# Patient Record
Sex: Female | Born: 1945
Health system: Southern US, Community
[De-identification: ages and names within clinical notes are randomized; demographics above are authoritative.]

## PROBLEM LIST (undated history)

## (undated) DIAGNOSIS — G709 Myoneural disorder, unspecified: Secondary | ICD-10-CM

## (undated) DIAGNOSIS — F419 Anxiety disorder, unspecified: Secondary | ICD-10-CM

## (undated) DIAGNOSIS — I10 Essential (primary) hypertension: Secondary | ICD-10-CM

## (undated) DIAGNOSIS — N6310 Unspecified lump in the right breast, unspecified quadrant: Secondary | ICD-10-CM

## (undated) DIAGNOSIS — R7303 Prediabetes: Secondary | ICD-10-CM

## (undated) DIAGNOSIS — M1990 Unspecified osteoarthritis, unspecified site: Secondary | ICD-10-CM

## (undated) DIAGNOSIS — E119 Type 2 diabetes mellitus without complications: Secondary | ICD-10-CM

## (undated) DIAGNOSIS — E785 Hyperlipidemia, unspecified: Secondary | ICD-10-CM

## (undated) HISTORY — DX: Type 2 diabetes mellitus without complications: E11.9

## (undated) HISTORY — PX: ABDOMINAL HYSTERECTOMY: SHX81

## (undated) HISTORY — PX: COLONOSCOPY W/ BIOPSIES: SHX1374

## (undated) HISTORY — DX: Essential (primary) hypertension: I10

## (undated) HISTORY — PX: BREAST BIOPSY: SHX20

## (undated) HISTORY — DX: Hyperlipidemia, unspecified: E78.5

---

## 1998-01-12 ENCOUNTER — Ambulatory Visit (HOSPITAL_COMMUNITY): Admission: RE | Admit: 1998-01-12 | Discharge: 1998-01-12 | Payer: Self-pay | Admitting: Obstetrics & Gynecology

## 1999-01-17 ENCOUNTER — Encounter: Payer: Self-pay | Admitting: Obstetrics & Gynecology

## 1999-01-17 ENCOUNTER — Ambulatory Visit (HOSPITAL_COMMUNITY): Admission: RE | Admit: 1999-01-17 | Discharge: 1999-01-17 | Payer: Self-pay | Admitting: Obstetrics & Gynecology

## 2000-08-28 ENCOUNTER — Other Ambulatory Visit: Admission: RE | Admit: 2000-08-28 | Discharge: 2000-08-28 | Payer: Self-pay | Admitting: Obstetrics and Gynecology

## 2001-10-01 ENCOUNTER — Other Ambulatory Visit: Admission: RE | Admit: 2001-10-01 | Discharge: 2001-10-01 | Payer: Self-pay | Admitting: Obstetrics and Gynecology

## 2002-11-04 ENCOUNTER — Other Ambulatory Visit: Admission: RE | Admit: 2002-11-04 | Discharge: 2002-11-04 | Payer: Self-pay | Admitting: Obstetrics and Gynecology

## 2003-11-12 ENCOUNTER — Other Ambulatory Visit: Admission: RE | Admit: 2003-11-12 | Discharge: 2003-11-12 | Payer: Self-pay | Admitting: Obstetrics and Gynecology

## 2004-12-05 ENCOUNTER — Other Ambulatory Visit: Admission: RE | Admit: 2004-12-05 | Discharge: 2004-12-05 | Payer: Self-pay | Admitting: Obstetrics and Gynecology

## 2006-02-22 ENCOUNTER — Ambulatory Visit: Payer: Self-pay | Admitting: Cardiology

## 2007-02-04 ENCOUNTER — Encounter: Admission: RE | Admit: 2007-02-04 | Discharge: 2007-02-04 | Payer: Self-pay | Admitting: Obstetrics and Gynecology

## 2008-04-13 ENCOUNTER — Encounter: Admission: RE | Admit: 2008-04-13 | Discharge: 2008-04-13 | Payer: Self-pay | Admitting: Obstetrics and Gynecology

## 2008-11-22 ENCOUNTER — Encounter: Admission: RE | Admit: 2008-11-22 | Discharge: 2008-12-13 | Payer: Self-pay | Admitting: Sports Medicine

## 2009-07-12 ENCOUNTER — Encounter: Payer: Self-pay | Admitting: Cardiology

## 2009-08-19 ENCOUNTER — Ambulatory Visit: Payer: Self-pay | Admitting: Cardiology

## 2009-08-19 DIAGNOSIS — E782 Mixed hyperlipidemia: Secondary | ICD-10-CM | POA: Insufficient documentation

## 2010-11-12 ENCOUNTER — Encounter: Payer: Self-pay | Admitting: Obstetrics and Gynecology

## 2010-11-19 LAB — CONVERTED CEMR LAB: CRP, High Sensitivity: 2.1 (ref 0.00–5.00)

## 2010-11-23 NOTE — Assessment & Plan Note (Signed)
Summary: NP6/ HYPOLIPIDEMIA/ PT HAS [employer]/ GD   Visit Type:  new pt vist Referring Provider:  Harold Hedge Primary Provider:  Dr Henderson Cloud  CC:  pt referred for hyperlipidemia.Marland Kitchenpt states she has gained alot of weight...  History of Present Illness: Melissa Raymond is a delightful 65 year old white female who is referred for evaluation of hyperlipidemia.  I saw her initially in 2007 at which time we did not recommend pharmacological therapy because of a high HDL.  Her risk factors for coronary artery disease include age, and hyperlipidemia. She does not have a history of hypertension, diabetes, premature family history of coronary disease, and does not smoke. eridestotal cholesterol this time was 315, triglycerides 150, LDL 200, HDL of 85 with a total cholesterol ratio ratio of 3.7. Looking back at her labs, her HDL is 106 with a cholesterol ratio ratio of 2.6 in 2007.  She notes that she has gained weight because of a left foot injury but is now getting back into her exercise routine. She use exercises daily.  She denies any angina or ischemic equivalence.  Current Medications (verified): 1)  Alendronate Sodium 70 Mg Tabs (Alendronate Sodium) .Marland Kitchen.. 1 Tab Weekly 2)  Vitamin D (Ergocalciferol) 50000 Unit Caps (Ergocalciferol) .Marland Kitchen.. 1 Tab Weekly..not Started Yet  Allergies: 1)  ! Pcn  Past History:  Past Medical History: Last updated: 08/18/2009 HYPERLIPIDEMIA-MIXED (ICD-272.4)    Past Surgical History: Last updated: 08/18/2009 Hysterectomy..25 yrs ago Colonoscopy..2005 Rhinoplasty  Family History: Last updated: 08/18/2009 Family History of Coronary Artery Disease:  Fx hx of Respitory disease/Asthma Fx hx of Anemia Fx hx or Thyroid disease Fx hx of UTI's Fx hx of Osteoporosis Fx hx Arthritis Family History of Diabetes:  Fx hx of Skin disease Family History of Hypertension:   Social History: Last updated: 08/18/2009 Married  Alcohol Use - yes.Marland Kitchen2-3 drinks per day Drug Use  - yes  Review of Systems       negative other than history of present illness  Vital Signs:  Patient profile:   65 year old female Height:      65 inches Weight:      145 pounds BMI:     24.22 Pulse rate:   70 / minute Pulse rhythm:   regular BP sitting:   132 / 80  (left arm) Cuff size:   large  Vitals Entered By: Danielle Rankin, CMA (August 19, 2009 2:31 PM)  Physical Exam  General:  Well developed, well nourished, in no acute distress. Head:  normocephalic and atraumatic Eyes:  PERRLA/EOM intact; conjunctiva and lids normal. Mouth:  Teeth, gums and palate normal. Oral mucosa normal. Neck:  Neck supple, no JVD. No masses, thyromegaly or abnormal cervical nodes. Chest Wall:  no deformities or breast masses noted Lungs:  Clear bilaterally to auscultation and percussion. Heart:  Non-displaced PMI, chest non-tender; regular rate and rhythm, S1, S2 without murmurs, rubs or gallops. Carotid upstroke normal, no bruit. Normal abdominal aortic size, no bruits. Femorals normal pulses, no bruits. Pedals normal pulses. No edema, no varicosities. Abdomen:  Bowel sounds positive; abdomen soft and non-tender without masses, organomegaly, or hernias noted. No hepatosplenomegaly. Msk:  Back normal, normal gait. Muscle strength and tone normal. Pulses:  pulses normal in all 4 extremities Extremities:  No clubbing or cyanosis. Neurologic:  Alert and oriented x 3. Skin:  Intact without lesions or rashes. Psych:  Normal affect.   Problems:  Medical Problems Added: 1)  Dx of Hyperlipidemia Type Iib / Iii  (ICD-272.2)  EKG  Procedure date:  08/19/2009  Findings:      normal sinus rhythm, left axis deviation, poor R-wave progression with no change except increased heart rate since 2007.  Impression & Recommendations:  Problem # 1:  HYPERLIPIDEMIA TYPE IIB / III (ICD-272.2) Assessment Deteriorated  Orders: TLB-CRP-High Sensitivity (C-Reactive Protein) (86140-FCRP) EKG w/  Interpretation (93000) Since last evaluation, and there is now a good day therefore primary prevention in patients who have a high C-reactive protein even the setting of relatively normal cholesterol levels. These patients also had HDLs were not suppressed. I think it's worthwhile to check a C-reactive protein. If it is high would consider a statin. If not, I suspect that with exercise, weight reduction, her numbers will improve. Overall her risk is still quite low. Regardless, like to see her back in 2 years to reassess. She is in agreement with this plan.  Patient Instructions: 1)  Your physician recommends that you schedule a follow-up appointment in: 2 years 2)  Your physician recommends that you continue on your current medications as directed. Please refer to the Current Medication list given to you today.

## 2010-11-23 NOTE — Letter (Signed)
Summary: [facility]   Imported By: Kassie Mends 09/16/2009 08:22:33  _____________________________________________________________________  External Attachment:    Type:   Image     Comment:   External Document

## 2012-05-22 DIAGNOSIS — L02219 Cutaneous abscess of trunk, unspecified: Secondary | ICD-10-CM | POA: Diagnosis not present

## 2012-05-22 DIAGNOSIS — L0291 Cutaneous abscess, unspecified: Secondary | ICD-10-CM | POA: Diagnosis not present

## 2012-09-16 DIAGNOSIS — H2510 Age-related nuclear cataract, unspecified eye: Secondary | ICD-10-CM | POA: Diagnosis not present

## 2013-05-01 DIAGNOSIS — L723 Sebaceous cyst: Secondary | ICD-10-CM | POA: Diagnosis not present

## 2014-03-12 DIAGNOSIS — R609 Edema, unspecified: Secondary | ICD-10-CM | POA: Diagnosis not present

## 2014-03-18 ENCOUNTER — Encounter: Payer: Self-pay | Admitting: Cardiovascular Disease

## 2014-03-18 DIAGNOSIS — E7800 Pure hypercholesterolemia, unspecified: Secondary | ICD-10-CM | POA: Diagnosis not present

## 2014-03-24 ENCOUNTER — Encounter: Payer: Self-pay | Admitting: Cardiovascular Disease

## 2014-03-24 DIAGNOSIS — R7309 Other abnormal glucose: Secondary | ICD-10-CM | POA: Diagnosis not present

## 2014-04-20 ENCOUNTER — Ambulatory Visit (INDEPENDENT_AMBULATORY_CARE_PROVIDER_SITE_OTHER): Payer: Medicare Other | Admitting: Cardiovascular Disease

## 2014-04-20 ENCOUNTER — Encounter: Payer: Self-pay | Admitting: Cardiovascular Disease

## 2014-04-20 VITALS — BP 120/80 | HR 67 | Ht 65.0 in | Wt 139.2 lb

## 2014-04-20 DIAGNOSIS — E782 Mixed hyperlipidemia: Secondary | ICD-10-CM

## 2014-04-20 NOTE — Assessment & Plan Note (Signed)
Melissa Raymond presents with significant hyperlipidemia. We will have her continue with a good diet.   She has been on a diet but really does not have much weight to lose.  I suspect that most of this is genetic.    We'll check lipids in 2 months.  . I suspect that we'll need to start her on a statin. I'll see her again in 6 months for followup visit.

## 2014-04-20 NOTE — Progress Notes (Signed)
     Melissa Raymond Date of Birth  [birth year]       [facility] Office [address], [place], [place]  [address]   [place], [place]  [address] [place]   Fax  [phone]     Fax [phone]   Previous patient of Dr. Verl Blalock  Problem List: 1. Hyperlipidemia  History of Present Illness:  Melissa Raymond is a 68 yo with hx of hyperlipidemia.   Strong family hx of CVA.   She is healthy.   She does elliptical and walks regularly.  Does yoga regularly.   Has lost 8 lbs in the past 2-3 weeks because her cholesterol level was up.    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   No current facility-administered medications on file prior to visit.    Allergies  Allergen Reactions  . Penicillins     No past medical history on file.  No past surgical history on file.  History  Smoking status  . Never Smoker   Smokeless tobacco  . Not on file    History  Alcohol Use: Not on file    No family history on file.  Reviw of Systems:  Reviewed in the HPI.  All other systems are negative.  Physical Exam: Blood pressure 120/80, pulse 67, height 5\' 5"  (1.651 m), weight 139 lb 3.2 oz (63.141 kg). Wt Readings from Last 3 Encounters:  04/20/14 139 lb 3.2 oz (63.141 kg)  08/19/09 145 lb (65.772 kg)     General: Well developed, well nourished, in no acute distress.  Head: Normocephalic, atraumatic, sclera non-icteric, mucus membranes are moist,   Neck: Supple. Carotids are 2 + without bruits. No JVD   Lungs: Clear   Heart: RR, normal s1s2  Abdomen: Soft, non-tender, non-distended with normal bowel sounds.  Msk:  Strength and tone are normal   Extremities: No clubbing or cyanosis. No edema.  Distal pedal pulses are 2+ and equal    Neuro: CN II - XII intact.  Alert and oriented X 3.   Psych:  Normal    ECG: NSR at 67.  Normal.   Assessment / Plan:

## 2014-04-20 NOTE — Patient Instructions (Addendum)
Your physician recommends that you continue on your current medications as directed. Please refer to the Current Medication list given to you today.  Your physician recommends that you return for lab work in: 2 months - September 1 anytime between 7:30 am and 4:30 pm You will need to FAST for this appointment - nothing to eat or drink after midnight the night before except water.  Your physician wants you to follow-up in: 6 months with Dr. Acie Fredrickson.  You will receive a reminder letter in the mail two months in advance. If you don't receive a letter, please call our office to schedule the follow-up appointment.

## 2014-06-17 DIAGNOSIS — L820 Inflamed seborrheic keratosis: Secondary | ICD-10-CM | POA: Diagnosis not present

## 2014-06-22 ENCOUNTER — Other Ambulatory Visit (INDEPENDENT_AMBULATORY_CARE_PROVIDER_SITE_OTHER): Payer: Medicare Other

## 2014-06-22 DIAGNOSIS — E782 Mixed hyperlipidemia: Secondary | ICD-10-CM

## 2014-06-22 LAB — BASIC METABOLIC PANEL
BUN: 13 mg/dL (ref 6–23)
CO2: 29 mEq/L (ref 19–32)
Calcium: 9.7 mg/dL (ref 8.4–10.5)
Chloride: 101 mEq/L (ref 96–112)
Creatinine, Ser: 0.6 mg/dL (ref 0.4–1.2)
GFR: 100.16 mL/min (ref >60.00)
Glucose, Bld: 127 mg/dL — ABNORMAL HIGH (ref 70–99)
Potassium: 4 mEq/L (ref 3.5–5.1)
Sodium: 138 mEq/L (ref 135–145)

## 2014-06-22 LAB — LIPID PANEL
Cholesterol: 285 mg/dL — ABNORMAL HIGH (ref 0–200)
HDL: 85 mg/dL (ref >39.00)
LDL Cholesterol: 172 mg/dL — ABNORMAL HIGH (ref 0–99)
NonHDL: 200
Total CHOL/HDL Ratio: 3
Triglycerides: 140 mg/dL (ref 0.0–149.0)
VLDL: 28 mg/dL (ref 0.0–40.0)

## 2014-06-22 LAB — HEPATIC FUNCTION PANEL
ALT: 23 U/L (ref 0–35)
AST: 22 U/L (ref 0–37)
Albumin: 4.1 g/dL (ref 3.5–5.2)
Alkaline Phosphatase: 48 U/L (ref 39–117)
Bilirubin, Direct: 0 mg/dL (ref 0.0–0.3)
Total Bilirubin: 0.8 mg/dL (ref 0.2–1.2)
Total Protein: 7.2 g/dL (ref 6.0–8.3)

## 2014-06-24 ENCOUNTER — Telehealth: Payer: Self-pay | Admitting: Nurse Practitioner

## 2014-06-24 DIAGNOSIS — E785 Hyperlipidemia, unspecified: Secondary | ICD-10-CM

## 2014-06-24 MED ORDER — ATORVASTATIN CALCIUM 40 MG PO TABS
40.0000 mg | ORAL_TABLET | Freq: Every day | ORAL | Status: DC
Start: 2014-06-24 — End: 2014-07-21

## 2014-06-24 NOTE — Telephone Encounter (Signed)
Message copied by Emmaline Life on Thu Jun 24, 2014 10:57 AM ------      Message from: Thayer Headings      Created: Thu Jun 24, 2014  9:36 AM       Please start atorvastatin 40 mg a day.      Recheck fasting labs in 3 months ------

## 2014-06-24 NOTE — Telephone Encounter (Signed)
Reviewed lab results and plan of care to start Lipitor 40 mg once daily and to recheck lipid/liver profile in 3 months with patient who verbalized understanding and agreement.  Rx sent, Orders in epic and patient scheduled for lab appointment on 12/3.

## 2014-07-21 ENCOUNTER — Other Ambulatory Visit: Payer: Self-pay | Admitting: *Deleted

## 2014-07-21 MED ORDER — ATORVASTATIN CALCIUM 40 MG PO TABS: 40.0000 mg | ORAL_TABLET | Freq: Every day | ORAL | Status: DC

## 2014-07-21 NOTE — Telephone Encounter (Signed)
Requested Prescriptions   Signed Prescriptions Disp Refills  . atorvastatin (LIPITOR) 40 MG tablet 90 tablet 3    Sig: Take 1 tablet (40 mg total) by mouth daily.    Authorizing Provider: Thayer Headings    Ordering User: Britt Bottom

## 2014-08-31 DIAGNOSIS — M47817 Spondylosis without myelopathy or radiculopathy, lumbosacral region: Secondary | ICD-10-CM | POA: Diagnosis not present

## 2014-08-31 DIAGNOSIS — M545 Low back pain: Secondary | ICD-10-CM | POA: Diagnosis not present

## 2014-08-31 DIAGNOSIS — S32010D Wedge compression fracture of first lumbar vertebra, subsequent encounter for fracture with routine healing: Secondary | ICD-10-CM | POA: Diagnosis not present

## 2014-09-02 ENCOUNTER — Ambulatory Visit: Payer: Medicare Other | Attending: Physician Assistant

## 2014-09-02 DIAGNOSIS — E780 Pure hypercholesterolemia: Secondary | ICD-10-CM | POA: Insufficient documentation

## 2014-09-02 DIAGNOSIS — M545 Low back pain: Secondary | ICD-10-CM | POA: Insufficient documentation

## 2014-09-02 DIAGNOSIS — Z5189 Encounter for other specified aftercare: Secondary | ICD-10-CM | POA: Diagnosis not present

## 2014-09-06 ENCOUNTER — Ambulatory Visit: Payer: Medicare Other

## 2014-09-06 DIAGNOSIS — Z5189 Encounter for other specified aftercare: Secondary | ICD-10-CM | POA: Diagnosis not present

## 2014-09-06 DIAGNOSIS — E780 Pure hypercholesterolemia: Secondary | ICD-10-CM | POA: Diagnosis not present

## 2014-09-06 DIAGNOSIS — M545 Low back pain: Secondary | ICD-10-CM | POA: Diagnosis not present

## 2014-09-09 ENCOUNTER — Ambulatory Visit: Payer: Medicare Other | Admitting: Rehabilitation

## 2014-09-13 ENCOUNTER — Ambulatory Visit: Payer: Medicare Other | Admitting: Rehabilitation

## 2014-09-13 DIAGNOSIS — E780 Pure hypercholesterolemia: Secondary | ICD-10-CM | POA: Diagnosis not present

## 2014-09-13 DIAGNOSIS — M545 Low back pain: Secondary | ICD-10-CM | POA: Diagnosis not present

## 2014-09-13 DIAGNOSIS — Z5189 Encounter for other specified aftercare: Secondary | ICD-10-CM | POA: Diagnosis not present

## 2014-09-20 ENCOUNTER — Ambulatory Visit: Payer: Medicare Other | Admitting: Rehabilitation

## 2014-09-20 DIAGNOSIS — M545 Low back pain: Secondary | ICD-10-CM | POA: Diagnosis not present

## 2014-09-20 DIAGNOSIS — Z5189 Encounter for other specified aftercare: Secondary | ICD-10-CM | POA: Diagnosis not present

## 2014-09-20 DIAGNOSIS — E780 Pure hypercholesterolemia: Secondary | ICD-10-CM | POA: Diagnosis not present

## 2014-09-23 ENCOUNTER — Other Ambulatory Visit (INDEPENDENT_AMBULATORY_CARE_PROVIDER_SITE_OTHER): Payer: Medicare Other | Admitting: *Deleted

## 2014-09-23 DIAGNOSIS — E785 Hyperlipidemia, unspecified: Secondary | ICD-10-CM

## 2014-09-23 LAB — HEPATIC FUNCTION PANEL
ALT: 25 U/L (ref 0–35)
AST: 21 U/L (ref 0–37)
Albumin: 4.6 g/dL (ref 3.5–5.2)
Alkaline Phosphatase: 49 U/L (ref 39–117)
Bilirubin, Direct: 0 mg/dL (ref 0.0–0.3)
Total Bilirubin: 1 mg/dL (ref 0.2–1.2)
Total Protein: 7.5 g/dL (ref 6.0–8.3)

## 2014-09-23 LAB — LIPID PANEL
Cholesterol: 226 mg/dL — ABNORMAL HIGH (ref 0–200)
HDL: 78.5 mg/dL (ref >39.00)
LDL Cholesterol: 126 mg/dL — ABNORMAL HIGH (ref 0–99)
NonHDL: 147.5
Total CHOL/HDL Ratio: 3
Triglycerides: 109 mg/dL (ref 0.0–149.0)
VLDL: 21.8 mg/dL (ref 0.0–40.0)

## 2014-09-24 ENCOUNTER — Ambulatory Visit: Payer: Medicare Other | Admitting: Rehabilitation

## 2014-09-27 ENCOUNTER — Telehealth: Payer: Self-pay | Admitting: Cardiovascular Disease

## 2014-09-27 ENCOUNTER — Ambulatory Visit: Payer: Medicare Other | Attending: Physician Assistant | Admitting: Rehabilitation

## 2014-09-27 DIAGNOSIS — M545 Low back pain: Secondary | ICD-10-CM | POA: Insufficient documentation

## 2014-09-27 DIAGNOSIS — E780 Pure hypercholesterolemia: Secondary | ICD-10-CM | POA: Diagnosis not present

## 2014-09-27 DIAGNOSIS — Z5189 Encounter for other specified aftercare: Secondary | ICD-10-CM | POA: Diagnosis not present

## 2014-09-27 NOTE — Telephone Encounter (Signed)
Reviewed lab result numbers with patient who verbalized understanding and is aware that Dr. Acie Fredrickson will discuss further at office visit on 12/30.

## 2014-09-27 NOTE — Telephone Encounter (Signed)
Follow Up  Pt called to follow up on call//sr

## 2014-10-01 ENCOUNTER — Ambulatory Visit: Payer: Medicare Other | Admitting: Rehabilitation

## 2014-10-08 ENCOUNTER — Other Ambulatory Visit: Payer: Self-pay | Admitting: *Deleted

## 2014-10-08 MED ORDER — ATORVASTATIN CALCIUM 40 MG PO TABS: 40.0000 mg | ORAL_TABLET | Freq: Every day | ORAL | Status: DC

## 2014-10-20 ENCOUNTER — Encounter: Payer: Self-pay | Admitting: Cardiovascular Disease

## 2014-10-20 ENCOUNTER — Ambulatory Visit (INDEPENDENT_AMBULATORY_CARE_PROVIDER_SITE_OTHER): Payer: Medicare Other | Admitting: Cardiovascular Disease

## 2014-10-20 VITALS — BP 136/76 | HR 77 | Ht 65.0 in | Wt 149.0 lb

## 2014-10-20 DIAGNOSIS — E782 Mixed hyperlipidemia: Secondary | ICD-10-CM | POA: Diagnosis not present

## 2014-10-20 MED ORDER — ATORVASTATIN CALCIUM 80 MG PO TABS: 80.0000 mg | ORAL_TABLET | Freq: Every day | ORAL | Status: DC

## 2014-10-20 NOTE — Progress Notes (Signed)
     Melissa Raymond Date of Birth  1946-04-08       [facility] Office [address] [place], [place], [place]  [address]   [place], [place]  [address] [place]   Fax  [phone]     Fax [phone]   Previous patient of Dr. Verl Blalock  Problem List: 1. Hyperlipidemia  History of Present Illness:  Melissa Raymond is a 68 yo with hx of hyperlipidemia.   Strong family hx of CVA.   She is healthy.   She does elliptical and walks regularly.  Does yoga regularly.   Has lost 8 lbs in the past 2-3 weeks because her cholesterol level was up.    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 30, 2015:  Melissa Raymond is a 68 yo who we see for hx of hyperlipidemia. Exercising regularly  Did have a back injury but i getting back into working out    Current Outpatient Prescriptions on File Prior to Visit  Medication Sig Dispense Refill  . atorvastatin (LIPITOR) 40 MG tablet Take 1 tablet (40 mg total) by mouth daily. 90 tablet 3  . calcium carbonate (OS-CAL) 600 MG TABS tablet Take 600 mg by mouth 2 (two) times daily with a meal.     No current facility-administered medications on file prior to visit.    Allergies  Allergen Reactions  . Penicillins Swelling and Rash    No past medical history on file.  No past surgical history on file.  History  Smoking status  . Never Smoker   Smokeless tobacco  . Not on file    History  Alcohol Use: Not on file    No family history on file.  Reviw of Systems:  Reviewed in the HPI.  All other systems are negative.  Physical Exam: Blood pressure 136/76, pulse 77, height 5\' 5"  (1.651 m), weight 149 lb (67.586 kg), SpO2 97 %. Wt Readings from Last 3 Encounters:  10/20/14 149 lb (67.586 kg)  04/20/14 139 lb 3.2 oz (63.141 kg)  08/19/09 145 lb (65.772 kg)     General: Well developed, well nourished, in no acute distress.  Head: Normocephalic,  atraumatic, sclera non-icteric, mucus membranes are moist,   Neck: Supple. Carotids are 2 + without bruits. No JVD   Lungs: Clear   Heart: RR, normal s1s2  Abdomen: Soft, non-tender, non-distended with normal bowel sounds.  Msk:  Strength and tone are normal   Extremities: No clubbing or cyanosis. No edema.  Distal pedal pulses are 2+ and equal    Neuro: CN II - XII intact.  Alert and oriented X 3.   Psych:  Normal   ECG:  Assessment / Plan:

## 2014-10-20 NOTE — Patient Instructions (Addendum)
Your physician recommends that you return for lab work in: 3 months with [place] wants you to follow-up in: 1 year with Dr. Acie Fredrickson with FASTING labs at this time as well. You will receive a reminder letter in the mail two months in advance. If you don't receive a letter, please call our office to schedule the follow-up appointment.  Your physician recommends that you continue on your current medications as directed. Please refer to the Current Medication list given to you today.

## 2014-10-20 NOTE — Assessment & Plan Note (Signed)
Her lipids are better but her LDL is still slightly elevated. We will increase her atorvastatin 80 mg a day. We'll check fasting labs in 3 months. I'll see her in one year with fasting labs at that time.

## 2014-12-27 DIAGNOSIS — L720 Epidermal cyst: Secondary | ICD-10-CM | POA: Diagnosis not present

## 2014-12-27 DIAGNOSIS — L718 Other rosacea: Secondary | ICD-10-CM | POA: Diagnosis not present

## 2015-01-19 ENCOUNTER — Other Ambulatory Visit (INDEPENDENT_AMBULATORY_CARE_PROVIDER_SITE_OTHER): Payer: Medicare Other | Admitting: *Deleted

## 2015-01-19 DIAGNOSIS — E782 Mixed hyperlipidemia: Secondary | ICD-10-CM

## 2015-01-19 LAB — BASIC METABOLIC PANEL
BUN: 14 mg/dL (ref 6–23)
CO2: 31 mEq/L (ref 19–32)
Calcium: 9.8 mg/dL (ref 8.4–10.5)
Chloride: 100 mEq/L (ref 96–112)
Creatinine, Ser: 0.74 mg/dL (ref 0.40–1.20)
GFR: 83.04 mL/min (ref >60.00)
Glucose, Bld: 121 mg/dL — ABNORMAL HIGH (ref 70–99)
Potassium: 4 mEq/L (ref 3.5–5.1)
Sodium: 137 mEq/L (ref 135–145)

## 2015-01-19 LAB — HEPATIC FUNCTION PANEL
ALT: 31 U/L (ref 0–35)
AST: 22 U/L (ref 0–37)
Albumin: 4.4 g/dL (ref 3.5–5.2)
Alkaline Phosphatase: 54 U/L (ref 39–117)
Bilirubin, Direct: 0.1 mg/dL (ref 0.0–0.3)
Total Bilirubin: 0.6 mg/dL (ref 0.2–1.2)
Total Protein: 7.3 g/dL (ref 6.0–8.3)

## 2015-01-19 LAB — LIPID PANEL
Cholesterol: 216 mg/dL — ABNORMAL HIGH (ref 0–200)
HDL: 89.5 mg/dL (ref >39.00)
LDL Cholesterol: 102 mg/dL — ABNORMAL HIGH (ref 0–99)
NonHDL: 126.5
Total CHOL/HDL Ratio: 2
Triglycerides: 125 mg/dL (ref 0.0–149.0)
VLDL: 25 mg/dL (ref 0.0–40.0)

## 2015-01-21 ENCOUNTER — Telehealth: Payer: Self-pay | Admitting: Cardiovascular Disease

## 2015-01-21 NOTE — Telephone Encounter (Signed)
Informed the pt that per Dr Acie Fredrickson her labs showed her lipids are much better, and she should continue her diet and exercise program.  Informed the pt that Dr Acie Fredrickson will see her at her next office visit. Pt verbalized understanding and agrees with this plan.

## 2015-01-21 NOTE — Telephone Encounter (Signed)
Follow Up ° ° °Pt calling back for results °

## 2015-03-08 DIAGNOSIS — L218 Other seborrheic dermatitis: Secondary | ICD-10-CM | POA: Diagnosis not present

## 2015-03-08 DIAGNOSIS — L820 Inflamed seborrheic keratosis: Secondary | ICD-10-CM | POA: Diagnosis not present

## 2015-03-08 DIAGNOSIS — L718 Other rosacea: Secondary | ICD-10-CM | POA: Diagnosis not present

## 2015-03-08 DIAGNOSIS — L720 Epidermal cyst: Secondary | ICD-10-CM | POA: Diagnosis not present

## 2015-03-31 DIAGNOSIS — L0292 Furuncle, unspecified: Secondary | ICD-10-CM | POA: Diagnosis not present

## 2015-04-21 DIAGNOSIS — L02213 Cutaneous abscess of chest wall: Secondary | ICD-10-CM | POA: Diagnosis not present

## 2015-04-21 DIAGNOSIS — L0291 Cutaneous abscess, unspecified: Secondary | ICD-10-CM | POA: Diagnosis not present

## 2015-05-18 DIAGNOSIS — L723 Sebaceous cyst: Secondary | ICD-10-CM | POA: Diagnosis not present

## 2015-06-08 DIAGNOSIS — J869 Pyothorax without fistula: Secondary | ICD-10-CM | POA: Diagnosis not present

## 2015-06-21 DIAGNOSIS — H43392 Other vitreous opacities, left eye: Secondary | ICD-10-CM | POA: Diagnosis not present

## 2015-10-19 DIAGNOSIS — E785 Hyperlipidemia, unspecified: Secondary | ICD-10-CM | POA: Insufficient documentation

## 2015-10-31 ENCOUNTER — Ambulatory Visit: Payer: Medicare Other | Admitting: Cardiovascular Disease

## 2015-11-15 ENCOUNTER — Ambulatory Visit (INDEPENDENT_AMBULATORY_CARE_PROVIDER_SITE_OTHER): Payer: Medicare Other | Admitting: Cardiovascular Disease

## 2015-11-15 ENCOUNTER — Encounter: Payer: Self-pay | Admitting: Cardiovascular Disease

## 2015-11-15 VITALS — BP 134/86 | HR 67 | Ht 65.0 in | Wt 142.4 lb

## 2015-11-15 DIAGNOSIS — E785 Hyperlipidemia, unspecified: Secondary | ICD-10-CM | POA: Diagnosis not present

## 2015-11-15 LAB — COMPREHENSIVE METABOLIC PANEL
ALT: 28 U/L (ref 6–29)
AST: 21 U/L (ref 10–35)
Albumin: 4.4 g/dL (ref 3.6–5.1)
Alkaline Phosphatase: 52 U/L (ref 33–130)
BUN: 16 mg/dL (ref 7–25)
CO2: 26 mmol/L (ref 20–31)
Calcium: 9.6 mg/dL (ref 8.6–10.4)
Chloride: 100 mmol/L (ref 98–110)
Creat: 0.64 mg/dL (ref 0.50–0.99)
Glucose, Bld: 113 mg/dL — ABNORMAL HIGH (ref 65–99)
Potassium: 4.7 mmol/L (ref 3.5–5.3)
Sodium: 140 mmol/L (ref 135–146)
Total Bilirubin: 0.6 mg/dL (ref 0.2–1.2)
Total Protein: 6.9 g/dL (ref 6.1–8.1)

## 2015-11-15 LAB — LIPID PANEL
Cholesterol: 223 mg/dL — ABNORMAL HIGH (ref 125–200)
HDL: 97 mg/dL (ref >=46)
LDL Cholesterol: 102 mg/dL (ref <130)
Total CHOL/HDL Ratio: 2.3 Ratio (ref <=5.0)
Triglycerides: 122 mg/dL (ref <150)
VLDL: 24 mg/dL (ref <30)

## 2015-11-15 MED ORDER — ATORVASTATIN CALCIUM 80 MG PO TABS: 80.0000 mg | ORAL_TABLET | Freq: Every day | ORAL | Status: DC

## 2015-11-15 NOTE — Progress Notes (Signed)
Melissa Raymond Date of Birth  September 07, 1946       [facility] Office [address] [place], [place], [place]  [address]   [place], [place]  [address] [place]   Fax  [phone]     Fax [phone]   Previous patient of Dr. Verl Blalock  Problem List: 1. Hyperlipidemia  History of Present Illness:  Melissa Raymond is a 70 yo with hx of hyperlipidemia.   Strong family hx of CVA.   She is healthy.   She does elliptical and walks regularly.  Does yoga regularly.   Has lost 8 lbs in the past 2-3 weeks because her cholesterol level was up.    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 30, 2015:  Melissa Raymond is a 69 yo who we see for hx of hyperlipidemia. Exercising regularly  Did have a back injury but i getting back into working out   Jan. 24, 2017:  Melissa Raymond is seen today for follow up of her hyperlipidemia.  Getting some exercise.  Tolerating the Atorvastatin well.  Under lots of stress.    Current Outpatient Prescriptions on File Prior to Visit  Medication Sig Dispense Refill  . atorvastatin (LIPITOR) 80 MG tablet Take 1 tablet (80 mg total) by mouth daily. 90 tablet 3  . calcium carbonate (OS-CAL) 600 MG TABS tablet Take 600 mg by mouth 2 (two) times daily with a meal. Reported on 11/15/2015     No current facility-administered medications on file prior to visit.    Allergies  Allergen Reactions  . Penicillins Swelling and Rash    Past Medical History  Diagnosis Date  . Hyperlipidemia     Past Surgical History  Procedure Laterality Date  . No past surgeries      History  Smoking status  . Never Smoker   Smokeless tobacco  . Not on file    History  Alcohol Use: Not on file    No family history on file.  Reviw of Systems:  Reviewed in the HPI.  All other systems are negative.  Physical Exam: Blood pressure 134/86, pulse 67, height 5\' 5"  (1.651 m), weight 142 lb 6.4 oz  (64.592 kg). Wt Readings from Last 3 Encounters:  11/15/15 142 lb 6.4 oz (64.592 kg)  10/20/14 149 lb (67.586 kg)  04/20/14 139 lb 3.2 oz (63.141 kg)     General: Well developed, well nourished, in no acute distress.  Head: Normocephalic, atraumatic, sclera non-icteric, mucus membranes are moist,  Neck: Supple. Carotids are 2 + without bruits. No JVD  Lungs: Clear  Heart: RR, normal s1s2 Abdomen: Soft, non-tender, non-distended with normal bowel sounds. Msk:  Strength and tone are normal  Extremities: No clubbing or cyanosis. No edema.  Distal pedal pulses are 2+ and equal  Neuro: CN II - XII intact.  Alert and oriented X 3.  Psych:  Normal   ECG: Jan. 24, 2017:  NSR at 67.   Assessment / Plan:   1. Hyperlipidemia- continue current dose of atorvastatin. Her lipids been very well-controlled. We'll recheck fasting labs today. She has a strong family history of stroke. Her carotids are normal.   Hawkins Seaman, Wonda Cheng, MD  11/15/2015 8:44 AM    [address],  [place], [place]  [address] Pager [phone] Phone: [phone]; Fax: [phone]  [employer]  [address] [place], [place]  [address] [phone]   Fax [phone]

## 2015-11-15 NOTE — Patient Instructions (Signed)
Medication Instructions:  Your physician recommends that you continue on your current medications as directed. Please refer to the Current Medication list given to you today.   Labwork: TODAY - cholesterol, liver, basic metabolic panel   Testing/Procedures: None Ordered   Follow-Up: Your physician wants you to follow-up in: 1 year with Dr. Nahser.  You will receive a reminder letter in the mail two months in advance. If you don't receive a letter, please call our office to schedule the follow-up appointment.   If you need a refill on your cardiac medications before your next appointment, please call your pharmacy.   Thank you for choosing [facility]! Michelle Swinyer, RN [phone]   

## 2016-02-23 DIAGNOSIS — M25561 Pain in right knee: Secondary | ICD-10-CM | POA: Diagnosis not present

## 2016-04-16 DIAGNOSIS — L723 Sebaceous cyst: Secondary | ICD-10-CM | POA: Diagnosis not present

## 2016-07-05 ENCOUNTER — Encounter: Payer: Self-pay | Admitting: Family Medicine

## 2016-07-05 DIAGNOSIS — R7301 Impaired fasting glucose: Secondary | ICD-10-CM | POA: Diagnosis not present

## 2016-07-05 DIAGNOSIS — Z23 Encounter for immunization: Secondary | ICD-10-CM | POA: Diagnosis not present

## 2016-07-05 DIAGNOSIS — Z1159 Encounter for screening for other viral diseases: Secondary | ICD-10-CM | POA: Diagnosis not present

## 2016-07-05 DIAGNOSIS — F329 Major depressive disorder, single episode, unspecified: Secondary | ICD-10-CM | POA: Diagnosis not present

## 2016-07-05 DIAGNOSIS — K625 Hemorrhage of anus and rectum: Secondary | ICD-10-CM | POA: Diagnosis not present

## 2016-07-05 DIAGNOSIS — Z5181 Encounter for therapeutic drug level monitoring: Secondary | ICD-10-CM | POA: Diagnosis not present

## 2016-07-05 DIAGNOSIS — E7800 Pure hypercholesterolemia, unspecified: Secondary | ICD-10-CM | POA: Diagnosis not present

## 2016-07-05 DIAGNOSIS — M810 Age-related osteoporosis without current pathological fracture: Secondary | ICD-10-CM | POA: Diagnosis not present

## 2016-07-05 DIAGNOSIS — Z0000 Encounter for general adult medical examination without abnormal findings: Secondary | ICD-10-CM | POA: Diagnosis not present

## 2016-07-06 ENCOUNTER — Encounter: Payer: Self-pay | Admitting: Family Medicine

## 2016-07-31 ENCOUNTER — Encounter: Payer: Self-pay | Admitting: Skilled Nursing Facility1

## 2016-07-31 ENCOUNTER — Encounter: Payer: Medicare Other | Attending: Family Medicine | Admitting: Skilled Nursing Facility1

## 2016-07-31 DIAGNOSIS — E119 Type 2 diabetes mellitus without complications: Secondary | ICD-10-CM | POA: Insufficient documentation

## 2016-07-31 DIAGNOSIS — Z713 Dietary counseling and surveillance: Secondary | ICD-10-CM | POA: Diagnosis not present

## 2016-07-31 DIAGNOSIS — Z6824 Body mass index (BMI) 24.0-24.9, adult: Secondary | ICD-10-CM | POA: Diagnosis not present

## 2016-07-31 NOTE — Progress Notes (Signed)

## 2016-08-07 ENCOUNTER — Encounter: Payer: Medicare Other | Admitting: Dietician

## 2016-08-07 DIAGNOSIS — E119 Type 2 diabetes mellitus without complications: Secondary | ICD-10-CM

## 2016-08-07 DIAGNOSIS — Z713 Dietary counseling and surveillance: Secondary | ICD-10-CM | POA: Diagnosis not present

## 2016-08-07 DIAGNOSIS — M8588 Other specified disorders of bone density and structure, other site: Secondary | ICD-10-CM | POA: Diagnosis not present

## 2016-08-07 DIAGNOSIS — E2839 Other primary ovarian failure: Secondary | ICD-10-CM | POA: Diagnosis not present

## 2016-08-07 DIAGNOSIS — Z6824 Body mass index (BMI) 24.0-24.9, adult: Secondary | ICD-10-CM | POA: Diagnosis not present

## 2016-08-07 NOTE — Progress Notes (Signed)

## 2016-08-14 ENCOUNTER — Encounter: Payer: Medicare Other | Admitting: Skilled Nursing Facility1

## 2016-08-14 ENCOUNTER — Encounter: Payer: Self-pay | Admitting: Skilled Nursing Facility1

## 2016-08-14 DIAGNOSIS — Z6824 Body mass index (BMI) 24.0-24.9, adult: Secondary | ICD-10-CM | POA: Diagnosis not present

## 2016-08-14 DIAGNOSIS — E119 Type 2 diabetes mellitus without complications: Secondary | ICD-10-CM

## 2016-08-14 DIAGNOSIS — Z713 Dietary counseling and surveillance: Secondary | ICD-10-CM | POA: Diagnosis not present

## 2016-08-14 NOTE — Progress Notes (Signed)
Patient was seen on 08/14/2016 for the third of a series of three diabetes self-management courses at the [facility]. The following learning objectives were met by the patient during this class:  . State the amount of activity recommended for healthy living . Describe activities suitable for individual needs . Identify ways to regularly incorporate activity into daily life . Identify barriers to activity and ways to over come these barriers  Identify diabetes medications being personally used and their primary action for lowering glucose and possible side effects . Describe role of stress on blood glucose and develop strategies to address psychosocial issues . Identify diabetes complications and ways to prevent them  Explain how to manage diabetes during illness . Evaluate success in meeting personal goal . Establish 2-3 goals that they will plan to diligently work on until they return for the  51-monthfollow-up visit  Goals:   I will count my carb choices at most meals and snacks  I will be active  5 times a week  I will look at patterns in my record book at least 7 days a month  To help manage stress I will  yoga at least 3 times a week  Your patient has identified these potential barriers to change:  Stress  Plan:  Attend Monthly Diabetes Support Group as needed or make a future follow up appointment

## 2016-10-01 ENCOUNTER — Encounter: Payer: Self-pay | Admitting: Cardiovascular Disease

## 2016-10-01 DIAGNOSIS — E119 Type 2 diabetes mellitus without complications: Secondary | ICD-10-CM | POA: Diagnosis not present

## 2016-10-29 DIAGNOSIS — Z1211 Encounter for screening for malignant neoplasm of colon: Secondary | ICD-10-CM | POA: Diagnosis not present

## 2016-10-29 DIAGNOSIS — K5730 Diverticulosis of large intestine without perforation or abscess without bleeding: Secondary | ICD-10-CM | POA: Diagnosis not present

## 2016-11-06 ENCOUNTER — Other Ambulatory Visit: Payer: Self-pay | Admitting: *Deleted

## 2016-11-06 MED ORDER — ATORVASTATIN CALCIUM 80 MG PO TABS: 80.0000 mg | ORAL_TABLET | Freq: Every day | ORAL | 0 refills | Status: DC

## 2016-11-14 ENCOUNTER — Encounter: Payer: Self-pay | Admitting: Cardiovascular Disease

## 2016-11-14 ENCOUNTER — Ambulatory Visit (INDEPENDENT_AMBULATORY_CARE_PROVIDER_SITE_OTHER): Payer: Medicare Other | Admitting: Cardiovascular Disease

## 2016-11-14 ENCOUNTER — Encounter (INDEPENDENT_AMBULATORY_CARE_PROVIDER_SITE_OTHER): Payer: Self-pay

## 2016-11-14 VITALS — BP 160/100 | HR 71 | Ht 64.0 in | Wt 144.8 lb

## 2016-11-14 DIAGNOSIS — E782 Mixed hyperlipidemia: Secondary | ICD-10-CM

## 2016-11-14 DIAGNOSIS — I10 Essential (primary) hypertension: Secondary | ICD-10-CM | POA: Diagnosis not present

## 2016-11-14 NOTE — Patient Instructions (Signed)
Medication Instructions:  Your physician recommends that you continue on your current medications as directed. Please refer to the Current Medication list given to you today.   Labwork: TODAY - cholesterol, complete metabolic panel  Your physician recommends that you return for lab work in: 6 months on the day of or a few days before your office visit with Dr. Nahser.  You will need to FAST for this appointment - nothing to eat or drink after midnight the night before except water.   Testing/Procedures: None Ordered   Follow-Up: Your physician wants you to follow-up in: 6 months with Dr. Nahser.  You will receive a reminder letter in the mail two months in advance. If you don't receive a letter, please call our office to schedule the follow-up appointment.   If you need a refill on your cardiac medications before your next appointment, please call your pharmacy.   Thank you for choosing [facility]! Sandford Diop, RN [phone]    

## 2016-11-14 NOTE — Progress Notes (Signed)
Melissa Raymond Date of Birth  05/31/1946       [facility] Office [address] [place], [place], [place]  [address]   [place], [place]  [address] [place]   Fax  [phone]     Fax [phone]   Previous patient of Dr. Verl Blalock  Problem List: 1. Hyperlipidemia 2.  Diabetes Mellitus 3. Borderline HTN     Melissa Raymond is a 71 yo with hx of hyperlipidemia.   Strong family hx of CVA.   She is healthy.   She does elliptical and walks regularly.  Does yoga regularly.   Has lost 8 lbs in the past 2-3 weeks because her cholesterol level was up.    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 30, 2015:  Melissa Raymond is a 71 yo who we see for hx of hyperlipidemia. Exercising regularly  Did have a back injury but i getting back into working out   Jan. 24, 2017:  Melissa Raymond is seen today for follow up of her hyperlipidemia.  Getting some exercise.  Tolerating the Atorvastatin well.  Under lots of stress.   Jan. 24, 2018:  Under lots of stress.    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   ( [employer] )  [facility]    Current Outpatient Prescriptions on File Prior to Visit  Medication Sig Dispense Refill  . atorvastatin (LIPITOR) 80 MG tablet Take 1 tablet (80 mg total) by mouth daily. 30 tablet 0  . calcium carbonate (OS-CAL) 600 MG TABS tablet Take 600 mg by mouth 2 (two) times daily with a meal. Reported on 11/15/2015     No current facility-administered medications on file prior to visit.     Allergies  Allergen Reactions  . Penicillins Swelling and Rash    Past Medical History:  Diagnosis Date  . Hyperlipidemia     Past Surgical History:  Procedure Laterality Date  . NO PAST SURGERIES      History  Smoking Status  . Never Smoker  Smokeless Tobacco  . Never Used    History  Alcohol use Not on file    No family history on file.  Reviw of  Systems:  Reviewed in the HPI.  All other systems are negative.  Physical Exam: Blood pressure (!) 160/100, pulse 71, height 5\' 4"  (1.626 m), weight 144 lb 12.8 oz (65.7 kg). Wt Readings from Last 3 Encounters:  11/14/16 144 lb 12.8 oz (65.7 kg)  07/31/16 142 lb 6.4 oz (64.6 kg)  11/15/15 142 lb 6.4 oz (64.6 kg)     General: Well developed, well nourished, in no acute distress.  Head: Normocephalic, atraumatic, sclera non-icteric, mucus membranes are moist,  Neck: Supple. Carotids are 2 + without bruits. No JVD  Lungs: Clear  Heart: RR, normal s1s2 Abdomen: Soft, non-tender, non-distended with normal bowel sounds. Msk:  Strength and tone are normal  Extremities: No clubbing or cyanosis. No edema.  Distal pedal pulses are 2+ and equal  Neuro: CN II - XII intact.  Alert and oriented X 3.  Psych:  Normal   ECG: Jan. 24, 2018:    NSR at 71.   LAHB.    Assessment / Plan:   1. Hyperlipidemia- continue current dose of atorvastatin. Her lipids been very well-controlled. We'll recheck fasting labs today. She has a  strong family history of stroke. Her carotids are normal.  2. Hypertension:   BP is Limited today. She admits that she has not been sleeping well. She's been under lots of stress. I have reminded her to watch her salt intake. She will keep a record of her blood pressure for the next 6 months. I'll see her in 6 months. We'll start her on a low dose ARB if her blood pressure remains elevated.   Melissa Moores, MD  11/14/2016 8:41 AM    [address] [place],  [place], [place]  [address] Pager [phone] Phone: [phone]; Fax: [phone]

## 2016-11-15 LAB — LIPID PANEL
Chol/HDL Ratio: 2.4 ratio units (ref 0.0–4.4)
Cholesterol, Total: 218 mg/dL — ABNORMAL HIGH (ref 100–199)
HDL: 91 mg/dL (ref >39)
LDL Calculated: 107 mg/dL — ABNORMAL HIGH (ref 0–99)
Triglycerides: 101 mg/dL (ref 0–149)
VLDL Cholesterol Cal: 20 mg/dL (ref 5–40)

## 2016-11-15 LAB — COMPREHENSIVE METABOLIC PANEL
ALT: 27 IU/L (ref 0–32)
AST: 23 IU/L (ref 0–40)
Albumin/Globulin Ratio: 2 (ref 1.2–2.2)
Albumin: 4.6 g/dL (ref 3.6–4.8)
Alkaline Phosphatase: 62 IU/L (ref 39–117)
BUN/Creatinine Ratio: 20 (ref 12–28)
BUN: 14 mg/dL (ref 8–27)
Bilirubin Total: 0.5 mg/dL (ref 0.0–1.2)
CO2: 25 mmol/L (ref 18–29)
Calcium: 9.7 mg/dL (ref 8.7–10.3)
Chloride: 100 mmol/L (ref 96–106)
Creatinine, Ser: 0.7 mg/dL (ref 0.57–1.00)
GFR calc Af Amer: 102 mL/min/{1.73_m2} (ref >59)
GFR calc non Af Amer: 89 mL/min/{1.73_m2} (ref >59)
Globulin, Total: 2.3 g/dL (ref 1.5–4.5)
Glucose: 119 mg/dL — ABNORMAL HIGH (ref 65–99)
Potassium: 4.5 mmol/L (ref 3.5–5.2)
Sodium: 140 mmol/L (ref 134–144)
Total Protein: 6.9 g/dL (ref 6.0–8.5)

## 2016-12-03 ENCOUNTER — Other Ambulatory Visit: Payer: Self-pay | Admitting: *Deleted

## 2016-12-03 MED ORDER — ATORVASTATIN CALCIUM 80 MG PO TABS: 80.0000 mg | ORAL_TABLET | Freq: Every day | ORAL | 3 refills | Status: DC

## 2016-12-26 DIAGNOSIS — K635 Polyp of colon: Secondary | ICD-10-CM | POA: Diagnosis not present

## 2016-12-26 DIAGNOSIS — D126 Benign neoplasm of colon, unspecified: Secondary | ICD-10-CM | POA: Diagnosis not present

## 2016-12-30 ENCOUNTER — Encounter (HOSPITAL_COMMUNITY): Payer: Self-pay

## 2016-12-30 ENCOUNTER — Encounter (HOSPITAL_COMMUNITY)
Admission: EM | Disposition: A | Discharge status: Home / Self Care | Payer: Self-pay | Source: Home / Self Care | Attending: Emergency Medicine

## 2016-12-30 ENCOUNTER — Observation Stay (HOSPITAL_COMMUNITY)
Admission: EM | Admit: 2016-12-30 | Discharge: 2016-12-31 | Disposition: A | Discharge status: Home / Self Care | Payer: Medicare Other | Attending: Internal Medicine | Admitting: Internal Medicine

## 2016-12-30 DIAGNOSIS — K91840 Postprocedural hemorrhage and hematoma of a digestive system organ or structure following a digestive system procedure: Principal | ICD-10-CM | POA: Insufficient documentation

## 2016-12-30 DIAGNOSIS — Y838 Other surgical procedures as the cause of abnormal reaction of the patient, or of later complication, without mention of misadventure at the time of the procedure: Secondary | ICD-10-CM | POA: Insufficient documentation

## 2016-12-30 DIAGNOSIS — Z880 Allergy status to penicillin: Secondary | ICD-10-CM | POA: Diagnosis not present

## 2016-12-30 DIAGNOSIS — I10 Essential (primary) hypertension: Secondary | ICD-10-CM | POA: Diagnosis not present

## 2016-12-30 DIAGNOSIS — Y92530 Ambulatory surgery center as the place of occurrence of the external cause: Secondary | ICD-10-CM | POA: Insufficient documentation

## 2016-12-30 DIAGNOSIS — K5730 Diverticulosis of large intestine without perforation or abscess without bleeding: Secondary | ICD-10-CM | POA: Insufficient documentation

## 2016-12-30 DIAGNOSIS — E785 Hyperlipidemia, unspecified: Secondary | ICD-10-CM | POA: Diagnosis not present

## 2016-12-30 DIAGNOSIS — K922 Gastrointestinal hemorrhage, unspecified: Secondary | ICD-10-CM | POA: Diagnosis not present

## 2016-12-30 DIAGNOSIS — R739 Hyperglycemia, unspecified: Secondary | ICD-10-CM | POA: Insufficient documentation

## 2016-12-30 DIAGNOSIS — R109 Unspecified abdominal pain: Secondary | ICD-10-CM | POA: Diagnosis not present

## 2016-12-30 DIAGNOSIS — K921 Melena: Secondary | ICD-10-CM | POA: Diagnosis present

## 2016-12-30 DIAGNOSIS — K625 Hemorrhage of anus and rectum: Secondary | ICD-10-CM | POA: Diagnosis not present

## 2016-12-30 DIAGNOSIS — Z823 Family history of stroke: Secondary | ICD-10-CM | POA: Diagnosis not present

## 2016-12-30 DIAGNOSIS — R42 Dizziness and giddiness: Secondary | ICD-10-CM

## 2016-12-30 DIAGNOSIS — Z79899 Other long term (current) drug therapy: Secondary | ICD-10-CM | POA: Insufficient documentation

## 2016-12-30 HISTORY — PX: FLEXIBLE SIGMOIDOSCOPY: SHX5431

## 2016-12-30 LAB — CBC
HCT: 36.2 % (ref 36.0–46.0)
Hemoglobin: 11.5 g/dL — ABNORMAL LOW (ref 12.0–15.0)
MCH: 29.3 pg (ref 26.0–34.0)
MCHC: 31.8 g/dL (ref 30.0–36.0)
MCV: 92.1 fL (ref 78.0–100.0)
Platelets: 215 10*3/uL (ref 150–400)
RBC: 3.93 MIL/uL (ref 3.87–5.11)
RDW: 13.1 % (ref 11.5–15.5)
WBC: 8.3 10*3/uL (ref 4.0–10.5)

## 2016-12-30 LAB — COMPREHENSIVE METABOLIC PANEL
ALT: 26 U/L (ref 14–54)
AST: 23 U/L (ref 15–41)
Albumin: 3.8 g/dL (ref 3.5–5.0)
Alkaline Phosphatase: 44 U/L (ref 38–126)
Anion gap: 9 (ref 5–15)
BUN: 16 mg/dL (ref 6–20)
CO2: 24 mmol/L (ref 22–32)
Calcium: 9 mg/dL (ref 8.9–10.3)
Chloride: 106 mmol/L (ref 101–111)
Creatinine, Ser: 0.65 mg/dL (ref 0.44–1.00)
GFR calc Af Amer: >60 mL/min (ref >60)
GFR calc non Af Amer: >60 mL/min (ref >60)
Glucose, Bld: 158 mg/dL — ABNORMAL HIGH (ref 65–99)
Potassium: 4.2 mmol/L (ref 3.5–5.1)
Sodium: 139 mmol/L (ref 135–145)
Total Bilirubin: 0.6 mg/dL (ref 0.3–1.2)
Total Protein: 6.7 g/dL (ref 6.5–8.1)

## 2016-12-30 LAB — HEMOGLOBIN AND HEMATOCRIT, BLOOD
HCT: 31.1 % — ABNORMAL LOW (ref 36.0–46.0)
HCT: 28.7 % — ABNORMAL LOW (ref 36.0–46.0)
Hemoglobin: 10 g/dL — ABNORMAL LOW (ref 12.0–15.0)
Hemoglobin: 9.2 g/dL — ABNORMAL LOW (ref 12.0–15.0)

## 2016-12-30 LAB — TYPE AND SCREEN
ABO/RH(D): O POS
Antibody Screen: NEGATIVE

## 2016-12-30 LAB — ABO/RH: ABO/RH(D): O POS

## 2016-12-30 SURGERY — SIGMOIDOSCOPY, FLEXIBLE
Anesthesia: Moderate Sedation

## 2016-12-30 MED ORDER — FENTANYL CITRATE (PF) 100 MCG/2ML IJ SOLN
INTRAMUSCULAR | Status: DC | PRN
  Administered 2016-12-30 (×2): 25 ug via INTRAVENOUS

## 2016-12-30 MED ORDER — MIDAZOLAM HCL 10 MG/2ML IJ SOLN
INTRAMUSCULAR | Status: DC | PRN
  Administered 2016-12-30 (×2): 2 mg via INTRAVENOUS

## 2016-12-30 MED ORDER — SODIUM CHLORIDE 0.9 % IV SOLN: 250.0000 mL | INTRAVENOUS | Status: DC | PRN

## 2016-12-30 MED ORDER — ONDANSETRON HCL 4 MG/2ML IJ SOLN: 4.0000 mg | Freq: Four times a day (QID) | INTRAMUSCULAR | Status: DC | PRN

## 2016-12-30 MED ORDER — DIPHENHYDRAMINE HCL 50 MG/ML IJ SOLN
INTRAMUSCULAR | Status: AC
  Filled 2016-12-30: qty 1

## 2016-12-30 MED ORDER — ONDANSETRON HCL 4 MG PO TABS: 4.0000 mg | ORAL_TABLET | Freq: Four times a day (QID) | ORAL | Status: DC | PRN

## 2016-12-30 MED ORDER — SODIUM CHLORIDE 0.9 % IV BOLUS (SEPSIS)
1000.0000 mL | Freq: Once | INTRAVENOUS | Status: AC
  Administered 2016-12-30: 1000 mL via INTRAVENOUS

## 2016-12-30 MED ORDER — MIDAZOLAM HCL 5 MG/ML IJ SOLN
INTRAMUSCULAR | Status: AC
  Filled 2016-12-30: qty 2

## 2016-12-30 MED ORDER — SODIUM CHLORIDE 0.9% FLUSH: 3.0000 mL | Freq: Two times a day (BID) | INTRAVENOUS | Status: DC

## 2016-12-30 MED ORDER — SODIUM CHLORIDE 0.9 % IV SOLN: INTRAVENOUS | Status: DC

## 2016-12-30 MED ORDER — SODIUM CHLORIDE 0.9% FLUSH: 3.0000 mL | INTRAVENOUS | Status: DC | PRN

## 2016-12-30 MED ORDER — SODIUM CHLORIDE 0.9% FLUSH
3.0000 mL | Freq: Two times a day (BID) | INTRAVENOUS | Status: DC
  Administered 2016-12-30 – 2016-12-31 (×2): 3 mL via INTRAVENOUS

## 2016-12-30 MED ORDER — DIPHENHYDRAMINE HCL 50 MG/ML IJ SOLN
INTRAMUSCULAR | Status: DC | PRN
  Administered 2016-12-30: 25 mg via INTRAVENOUS

## 2016-12-30 MED ORDER — FENTANYL CITRATE (PF) 100 MCG/2ML IJ SOLN
INTRAMUSCULAR | Status: AC
  Filled 2016-12-30: qty 4

## 2016-12-30 NOTE — H&P (Addendum)
[facility] History and Physical  Melissa Raymond XIP:[id number] DOB: November 22, 1945 DOA: 12/30/2016  Referring physician: ER MD PCP: [name], MD   Chief Complaint: Rectal bleeding  HPI: Melissa Raymond is a 71 y.o. female with history of hypertension, hyperlipidemia, presents to the ED secondary to complaints of rectal bleeding that started this morning. Of note, she had a colonoscopy on March 7 with 3 polyps resected in 1 biopsy done. She says she was doing well. Her bowel movements are normal consistency until about 3:00 this morning when she started having some abdominal cramps. She woke up and had 2 bowel movements, but the lights were off and she did not look at her stool. Subsequently, she then woke up with one episode of bowel incontinence, with noted gross bloody stools that were dark mixed with clots, with associated abdominal cramping. Her husband brought her to the ED for further evaluations.  Since then she's had 3 more episodes of hematochezia with mix clots in the ER.  Of note, she endorses mild lightheadedness but no chest pain, no shortness of breath. No nausea, no vomiting, no hematemesis, hemoptysis.  She is currently not on any blood thinners. No prior history of GI bleeding in the past.  She does not smoke, but drank 1 beer last night.   Review of Systems:  Per hpi, o/w all systems reviewed and negative.  Past Medical History:  Diagnosis Date  . Hyperlipidemia    Past Surgical History:  Procedure Laterality Date  . NO PAST SURGERIES     Social History:  reports that she has never smoked. She has never used smokeless tobacco. Her alcohol and drug histories are not on file.   Allergies  Allergen Reactions  . Penicillins Swelling and Rash    Has patient had a PCN reaction causing immediate rash, facial/tongue/throat swelling, SOB or lightheadedness with hypotension: Yes Has patient had a PCN reaction causing severe rash involving mucus membranes or skin  necrosis: No Has patient had a PCN reaction that required hospitalization No Has patient had a PCN reaction occurring within the last 10 years: No If all of the above answers are "NO", then may proceed with Cephalosporin use.     Fmhx:  Strong family hx of cva.  Prior to Admission medications   Medication Sig Start Date End Date Taking? Authorizing Provider  atorvastatin (LIPITOR) 80 MG tablet Take 1 tablet (80 mg total) by mouth daily. 12/03/16  Yes Thayer Headings, MD  hydrocortisone cream 1 % Apply 1 application topically 2 (two) times daily.   Yes Historical Provider, MD  Multiple Vitamin (MULTIVITAMIN WITH MINERALS) TABS tablet Take 1 tablet by mouth daily.   Yes Historical Provider, MD   Physical Exam: Vitals:   12/30/16 1115 12/30/16 1135 12/30/16 1136 12/30/16 1136  BP: 120/67 124/62    Pulse: 76  73   Resp: 12  12   Temp:      TempSrc:      SpO2: 96%  97% 97%    Wt Readings from Last 3 Encounters:  11/14/16 65.7 kg (144 lb 12.8 oz)  07/31/16 64.6 kg (142 lb 6.4 oz)  11/15/15 64.6 kg (142 lb 6.4 oz)    General:  Appears calm and comfortable, pleasant, NAD, AAOx3, pleasant Eyes: PERRL, normal lids, irises & conjunctiva ENT: grossly normal hearing, lips & tongue, mmm Neck: no LAD, masses or thyromegaly Cardiovascular: RRR, no m/r/g. No LE edema. Telemetry: SR, no arrhythmias  Respiratory: CTA bilaterally, no w/r/r. Normal respiratory effort.  Abdomen: soft, mild abd discomfort on deep palpation diffusely.  No g/r. Skin: no rash or induration seen on limited exam Musculoskeletal: grossly normal tone BUE/BLE Psychiatric: grossly normal mood and affect, speech fluent and appropriate Neurologic: grossly non-focal.          Labs on Admission:  Basic Metabolic Panel:  Recent Labs Lab 12/30/16 1011  NA 139  K 4.2  CL 106  CO2 24  GLUCOSE 158*  BUN 16  CREATININE 0.65  CALCIUM 9.0   Liver Function Tests:  Recent Labs Lab 12/30/16 1011  AST 23  ALT 26   ALKPHOS 44  BILITOT 0.6  PROT 6.7  ALBUMIN 3.8   No results for input(s): LIPASE, AMYLASE in the last 168 hours. No results for input(s): AMMONIA in the last 168 hours. CBC:  Recent Labs Lab 12/30/16 1011  WBC 8.3  HGB 11.5*  HCT 36.2  MCV 92.1  PLT 215   Cardiac Enzymes: No results for input(s): CKTOTAL, CKMB, CKMBINDEX, TROPONINI in the last 168 hours.  BNP (last 3 results) No results for input(s): BNP in the last 8760 hours.  ProBNP (last 3 results) No results for input(s): PROBNP in the last 8760 hours.  CBG: No results for input(s): GLUCAP in the last 168 hours.  Radiological Exams on Admission: No results found.  EKG: no ekg  EKG Interpretation  Date/Time:    Ventricular Rate:    PR Interval:    QRS Duration:   QT Interval:    QTC Calculation:   R Axis:     Text Interpretation:          Assessment/Plan Principal Problem:   Hematochezia Active Problems:   Hyperlipidemia   Essential hypertension   1. Post-polypectomy hematochezia - colonoscopy was 12/26/16 - pt currently hemodynamically stable - obs tele - serial hh, type and screen has been done - gi/Dr Amedeo Plenty was called by ED, - trx for hb <8 - Addendum - 1:05pm, Dr Rees/ED talked to me., pt just got up and had another epi of hematochezia and near syncope.  Supportive care, add orthostatics, ns bolus 1 liter. Will keep npo for now, dw Dr Amedeo Plenty, to see her soon.  2. Diet controlled htn - currently normotensive. - watch for hypotension 2nd to bleeding.  3. hld - hold statin for now - strong fmx of cva.  4. Hyperglycemia - no hx of dm, chk a1c  [facility]/Dr Amedeo Plenty, called by ED, I also talked to Dr Amedeo Plenty.  Code Status: full DVT Prophylaxis: scds, not blood thinners due to active bleeding Family Communication: pt and husband in room (indicate person spoken with, if applicable, with phone number if by telephone) Disposition Plan: obs tele (indicate anticipated LOS)  Time spent:  34mins  Maren Reamer MD., MBA/MHA [facility] Pager [phone]

## 2016-12-30 NOTE — ED Triage Notes (Signed)
Patient complains of abdominal pain and several episodes of rectal bleeding mixed with stool since early am. States that she had colonoscopy on Wednesday and had some polyps removed, no nausea, no vomiting

## 2016-12-30 NOTE — Progress Notes (Signed)
New Admission Note:   Arrival Method: stretcher from ED Mental Orientation: alert and orientated x4  Telemetry: 6e13  Assessment: Completed Skin: see flow sheet  Iv:  Right wrist  Pain:denies  Tubes: n/a Safety Measures: Safety Fall Prevention Plan discussed  Admission: in process [facility] Orientation: Patient has been orientated to the room, unit and staff.  Family: husband at bedside   Orders have been reviewed and implemented. Will continue to monitor the patient. Call light has been placed within reach and bed alarm has been activated.   Emilio Math, RN [facility]  Phone number: [phone]

## 2016-12-30 NOTE — Plan of Care (Signed)
Problem: Education: Goal: Knowledge of [facility] Education information/materials will improve Outcome: Progressing Reviewed POC with pt and husband.

## 2016-12-30 NOTE — ED Notes (Signed)
MD at bedside updating pt that GI would be seeing her and approved for her to have clear liquids. Erin RN gave pt coffee.

## 2016-12-30 NOTE — ED Notes (Signed)
6E notified that this RN is waiting on page from GI prior to bringing up pt.

## 2016-12-30 NOTE — ED Notes (Signed)
ED Provider at bedside. 

## 2016-12-30 NOTE — Interval H&P Note (Signed)
History and Physical Interval Note:  12/30/2016 5:12 PM  Melissa Raymond  has presented today for surgery, with the diagnosis of rectal bleeding  The various methods of treatment have been discussed with the patient and family. After consideration of risks, benefits and other options for treatment, the patient has consented to  Procedure(s): FLEXIBLE SIGMOIDOSCOPY (N/A) as a surgical intervention .  The patient's history has been reviewed, patient examined, no change in status, stable for surgery.  I have reviewed the patient's chart and labs.  Questions were answered to the patient's satisfaction.     Dacian Orrico C

## 2016-12-30 NOTE — Op Note (Signed)
[facility] Patient Name: Melissa Raymond Procedure Date : 12/30/2016 MRN: [id number] Attending MD: Missy Sabins , MD Date of Birth: 1946-05-17 CSN: [id number] Age: 71 Admit Type: Inpatient Procedure:                Flexible Sigmoidoscopy Indications:              Treatment of bleeding from polypectomy site Providers:                Elyse Jarvis. Amedeo Plenty, MD, Carolynn Comment, RN, Cherylynn Ridges, Technician Referring MD:              Medicines:                Fentanyl 50 micrograms IV, Midazolam 5 mg IV,                            Diphenhydramine 25 mg IV Complications:            No immediate complications. Estimated Blood Loss:     Estimated blood loss was minimal. Procedure:                Pre-Anesthesia Assessment:                           - Prior to the procedure, a History and Physical                            was performed, and patient medications and                            allergies were reviewed. The patient's tolerance of                            previous anesthesia was also reviewed. The risks                            and benefits of the procedure and the sedation                            options and risks were discussed with the patient.                            All questions were answered, and informed consent                            was obtained. Prior Anticoagulants: The patient has                            taken no previous anticoagulant or antiplatelet                            agents. ASA Grade Assessment: I - A normal, healthy  patient. After reviewing the risks and benefits,                            the patient was deemed in satisfactory condition to                            undergo the procedure.                           After obtaining informed consent, the scope was                            passed under direct vision. The EC-[id number] ([id number])                            scope was  introduced through the anus and advanced                            to the the rectosigmoid junction. The flexible                            sigmoidoscopy was technically difficult and complex                            due to excessive bleeding, poor bowel prep and                            restricted mobility of the colon. Scope In: 5:20:43 PM Scope Out: 5:26:56 PM Total Procedure Duration: 0 hours 6 minutes 13 seconds  Findings:      A few diverticula were found in the recto-sigmoid colon.      Clotted blood was seen in the rectum and in the recto-sigmoid colon,       secondary to previous polypectomy procedure.      sharp angulation at the rectosigmoid precluded further advancement. Impression:               - Diverticulosis in the recto-sigmoid colon.                           - Bleeding in the rectum and in the recto-sigmoid                            colon secondary to previous polypectomy.                           - No specimens collected. Recommendation:           - Clear liquid diet.                           - Admit the patient to hospital ward until patient                            is stable.                           -  Repeat flexible sigmoidoscopy PRN for retreatment. Procedure Code(s):        --- Professional ---                           [id number], 33, Sigmoidoscopy, flexible; diagnostic,                            including collection of specimen(s) by brushing or                            washing, when performed (separate procedure) Diagnosis Code(s):        --- Professional ---                           K87.681, Postprocedural hemorrhage of a digestive                            system organ or structure following a digestive                            system procedure                           K57.30, Diverticulosis of large intestine without                            perforation or abscess without bleeding CPT copyright 2016 American Medical Association. All  rights reserved. The codes documented in this report are preliminary and upon coder review may  be revised to meet current compliance requirements. Missy Sabins, MD 12/30/2016 5:52:24 PM This report has been signed electronically. Number of Addenda: 0

## 2016-12-30 NOTE — Consult Note (Signed)
[facility] Consult Note  Referring Provider: No ref. provider found Primary Care Physician:  Melissa Smolder, MD Primary Gastroenterologist:  Dr.  Laurel Raymond Complaint: rectal bleeding HPI: Melissa Raymond is an 71 y.o. white female  Who underwent colonoscopy 3 days ago with removal of large piecemeal sigmoid polyp with 2 smaller tinier poly removed as well. She began having bright red blood per rectum without any pain this morning. She had a near syncopal episode in the emergency room.  Past Medical History:  Diagnosis Date  . Hyperlipidemia     Past Surgical History:  Procedure Laterality Date  . NO PAST SURGERIES       (Not in a hospital admission)  Allergies:  Allergies  Allergen Reactions  . Penicillins Swelling and Rash    Has patient had a PCN reaction causing immediate rash, facial/tongue/throat swelling, SOB or lightheadedness with hypotension: Yes Has patient had a PCN reaction causing severe rash involving mucus membranes or skin necrosis: No Has patient had a PCN reaction that required hospitalization No Has patient had a PCN reaction occurring within the last 10 years: No If all of the above answers are "NO", then may proceed with Cephalosporin use.     No family history on file.  Social History:  reports that she has never smoked. She has never used smokeless tobacco. Her alcohol and drug histories are not on file.  Review of Systems: negative except as above   Blood pressure 113/65, pulse 67, temperature 98.4 F (36.9 C), temperature source Oral, resp. rate 20, SpO2 96 %. Head: Normocephalic, without obvious abnormality, atraumatic Neck: no adenopathy, no carotid bruit, no JVD, supple, symmetrical, trachea midline and thyroid not enlarged, symmetric, no tenderness/mass/nodules Resp: clear to auscultation bilaterally Cardio: regular rate and rhythm, S1, S2 normal, no murmur, click, rub or gallop GI: abdomen soft nondistended wit nrmoactive bowel  sounds. No hepatosplenomegaly mass or guarding Extremities: extremities normal, atraumatic, no cyanosis or edema  Results for orders placed or performed during the hospital encounter of 12/30/16 (from the past 48 hour(s))  Type and screen [place]     Status: None   Collection Time: 12/30/16 10:10 AM  Result Value Ref Range   ABO/RH(D) O POS    Antibody Screen NEG    Sample Expiration 01/02/2017   ABO/Rh     Status: None (Preliminary result)   Collection Time: 12/30/16 10:10 AM  Result Value Ref Range   ABO/RH(D) O POS   Comprehensive metabolic panel     Status: Abnormal   Collection Time: 12/30/16 10:11 AM  Result Value Ref Range   Sodium 139 135 - 145 mmol/L   Potassium 4.2 3.5 - 5.1 mmol/L   Chloride 106 101 - 111 mmol/L   CO2 24 22 - 32 mmol/L   Glucose, Bld 158 (H) 65 - 99 mg/dL   BUN 16 6 - 20 mg/dL   Creatinine, Ser 0.65 0.44 - 1.00 mg/dL   Calcium 9.0 8.9 - 10.3 mg/dL   Total Protein 6.7 6.5 - 8.1 g/dL   Albumin 3.8 3.5 - 5.0 g/dL   AST 23 15 - 41 U/L   ALT 26 14 - 54 U/L   Alkaline Phosphatase 44 38 - 126 U/L   Total Bilirubin 0.6 0.3 - 1.2 mg/dL   GFR calc non Af Amer >60 >60 mL/min   GFR calc Af Amer >60 >60 mL/min    Comment: (NOTE) The eGFR has been calculated using the CKD EPI equation. This calculation has  not been validated in all clinical situations. eGFR's persistently <60 mL/min signify possible Chronic Kidney Disease.    Anion gap 9 5 - 15  CBC     Status: Abnormal   Collection Time: 12/30/16 10:11 AM  Result Value Ref Range   WBC 8.3 4.0 - 10.5 K/uL   RBC 3.93 3.87 - 5.11 MIL/uL   Hemoglobin 11.5 (L) 12.0 - 15.0 g/dL   HCT 36.2 36.0 - 46.0 %   MCV 92.1 78.0 - 100.0 fL   MCH 29.3 26.0 - 34.0 pg   MCHC 31.8 30.0 - 36.0 g/dL   RDW 13.1 11.5 - 15.5 %   Platelets 215 150 - 400 K/uL   No results found.  Assessment: Rectal bleeding likely post polypectomy from a large sigmoid colon piecemeal resection Plan:  Will proceed with  sigmoidoscopy was attempted fulguration or clipping of bleeding lesion. Melissa Raymond C 12/30/2016, 1:46 PM  Pager [phone] If no answer or after 5 PM call [phone]

## 2016-12-30 NOTE — ED Notes (Signed)
Pt had syncopal episode in RR where pt reports placing her head between her legs and feeling clammy. Pt pulled call bell and wheeled back to room. Pt now lying in bed VSS A&O denies LOC. MD made aware and will follow up with admitting.

## 2016-12-30 NOTE — ED Provider Notes (Signed)
[facility] DEPT Provider Note   CSN: [id number] Arrival date & time: 12/30/16  0848     History   Chief Complaint No chief complaint on file.   HPI Melissa Raymond is a 71 y.o. female.  The history is provided by the patient. No language interpreter was used.    Melissa Raymond is a 71 y.o. female who presents to the Emergency Department complaining of rectal bleeding. She had a colonoscopy on March 7 and had 3 polyps resected and an 1 and a polyp biopsy. This morning she woke at 3 AM with hematochezia. She has had 5 bloody bowel movements and mild lower abdominal cramping. She did have a one episode of bowel incontinence. She reports gross bloody stools that are dark and mixed with clots. She endorses mild lightheadedness. No chest pain, shortness of breath, fever, vomiting. No prior similar symptoms. She does not take any blood thinners and has not had a history of GI bleeding in the past.   Past Medical History:  Diagnosis Date  . Hyperlipidemia     Patient Active Problem List   Diagnosis Date Noted  . Hematochezia 12/30/2016  . Essential hypertension 11/14/2016  . Hyperlipidemia   . HYPERLIPIDEMIA TYPE IIB / III 08/19/2009    Past Surgical History:  Procedure Laterality Date  . NO PAST SURGERIES      OB History    No data available       Home Medications    Prior to Admission medications   Medication Sig Start Date End Date Taking? Authorizing Provider  atorvastatin (LIPITOR) 80 MG tablet Take 1 tablet (80 mg total) by mouth daily. 12/03/16  Yes Thayer Headings, MD  hydrocortisone cream 1 % Apply 1 application topically 2 (two) times daily.   Yes Historical Provider, MD  Multiple Vitamin (MULTIVITAMIN WITH MINERALS) TABS tablet Take 1 tablet by mouth daily.   Yes Historical Provider, MD    Family History No family history on file.  Social History Social History  Substance Use Topics  . Smoking status: Never Smoker  . Smokeless tobacco: Never Used  .  Alcohol use Not on file     Allergies   Penicillins   Review of Systems Review of Systems  All other systems reviewed and are negative.    Physical Exam Updated Vital Signs BP 124/62   Pulse 73   Temp 98.4 F (36.9 C) (Oral)   Resp 12   SpO2 97%   Physical Exam  Constitutional: She is oriented to person, place, and time. She appears well-developed and well-nourished.  HENT:  Head: Normocephalic and atraumatic.  Cardiovascular: Normal rate and regular rhythm.   No murmur heard. Pulmonary/Chest: Effort normal and breath sounds normal. No respiratory distress.  Abdominal: Soft. There is no rebound and no guarding.  Mild LLQ tenderness  Genitourinary:  Genitourinary Comments: Rectal exam with no external hemorrhoids.   Musculoskeletal: She exhibits no edema or tenderness.  Neurological: She is alert and oriented to person, place, and time.  Skin: Skin is warm and dry.  Psychiatric: She has a normal mood and affect. Her behavior is normal.  Nursing note and vitals reviewed.    ED Treatments / Results  Labs (all labs ordered are listed, but only abnormal results are displayed) Labs Reviewed  COMPREHENSIVE METABOLIC PANEL - Abnormal; Notable for the following:       Result Value   Glucose, Bld 158 (*)    All other components within normal limits  CBC - Abnormal;  Notable for the following:    Hemoglobin 11.5 (*)    All other components within normal limits  HEMOGLOBIN AND HEMATOCRIT, BLOOD  HEMOGLOBIN AND HEMATOCRIT, BLOOD  HEMOGLOBIN AND HEMATOCRIT, BLOOD  POC OCCULT BLOOD, ED  TYPE AND SCREEN  ABO/RH    EKG  EKG Interpretation None       Radiology No results found.  Procedures Procedures (including critical care time)  Medications Ordered in ED Medications  sodium chloride flush (NS) 0.9 % injection 3 mL (not administered)  sodium chloride flush (NS) 0.9 % injection 3 mL (not administered)  sodium chloride flush (NS) 0.9 % injection 3 mL (not  administered)  0.9 %  sodium chloride infusion (not administered)  ondansetron (ZOFRAN) tablet 4 mg (not administered)    Or  ondansetron (ZOFRAN) injection 4 mg (not administered)     Initial Impression / Assessment and Plan / ED Course  I have reviewed the triage vital signs and the nursing notes.  Pertinent labs & imaging results that were available during my care of the patient were reviewed by me and considered in my medical decision making (see chart for details).  Clinical Course as of Dec 30 1228  Sun Dec 30, 2016  1129 Pt discussed with Dr. Amedeo Plenty with [facility].  Plan to admit to Hospitalist service for observation.  Clear liquid diet ok.    [ER]    Clinical Course User Index [ER] Quintella Reichert, MD    Patient here for evaluation of hematochezia following recent colonoscopy. She is hemodynamically stable in the emergency department. Hemoglobin slightly decreased when compared to priors. Plan to admit to the hospitalist service for observation given the amount of hematochezia present prior to ED arrival as well as in the emergency department.  Final Clinical Impressions(s) / ED Diagnoses   Final diagnoses:  Acute lower GI bleeding    New Prescriptions New Prescriptions   No medications on file     Quintella Reichert, MD 12/30/16 1232

## 2016-12-30 NOTE — ED Notes (Signed)
Pt and family report that GI told pt that she would be staying in the ED. GI did not make this RN aware of this. Pt has bed on floor so GI has been paged about how to proceed with pt care.

## 2016-12-30 NOTE — ED Notes (Signed)
MD called this RN back and notified to call his RN Autumn who states that pt can go to room upstairs.

## 2016-12-31 DIAGNOSIS — K922 Gastrointestinal hemorrhage, unspecified: Secondary | ICD-10-CM | POA: Diagnosis not present

## 2016-12-31 DIAGNOSIS — I10 Essential (primary) hypertension: Secondary | ICD-10-CM | POA: Diagnosis not present

## 2016-12-31 DIAGNOSIS — K921 Melena: Secondary | ICD-10-CM | POA: Diagnosis not present

## 2016-12-31 DIAGNOSIS — K91840 Postprocedural hemorrhage and hematoma of a digestive system organ or structure following a digestive system procedure: Secondary | ICD-10-CM | POA: Diagnosis not present

## 2016-12-31 DIAGNOSIS — E785 Hyperlipidemia, unspecified: Secondary | ICD-10-CM

## 2016-12-31 LAB — BASIC METABOLIC PANEL
Anion gap: 7 (ref 5–15)
BUN: 10 mg/dL (ref 6–20)
CO2: 28 mmol/L (ref 22–32)
Calcium: 8.6 mg/dL — ABNORMAL LOW (ref 8.9–10.3)
Chloride: 105 mmol/L (ref 101–111)
Creatinine, Ser: 0.64 mg/dL (ref 0.44–1.00)
GFR calc Af Amer: >60 mL/min (ref >60)
GFR calc non Af Amer: >60 mL/min (ref >60)
Glucose, Bld: 120 mg/dL — ABNORMAL HIGH (ref 65–99)
Potassium: 4.2 mmol/L (ref 3.5–5.1)
Sodium: 140 mmol/L (ref 135–145)

## 2016-12-31 LAB — CBC
HCT: 30.1 % — ABNORMAL LOW (ref 36.0–46.0)
Hemoglobin: 9.6 g/dL — ABNORMAL LOW (ref 12.0–15.0)
MCH: 29.8 pg (ref 26.0–34.0)
MCHC: 31.9 g/dL (ref 30.0–36.0)
MCV: 93.5 fL (ref 78.0–100.0)
Platelets: 211 10*3/uL (ref 150–400)
RBC: 3.22 MIL/uL — ABNORMAL LOW (ref 3.87–5.11)
RDW: 13.4 % (ref 11.5–15.5)
WBC: 6.3 10*3/uL (ref 4.0–10.5)

## 2016-12-31 LAB — HEMOGLOBIN AND HEMATOCRIT, BLOOD
HCT: 28.6 % — ABNORMAL LOW (ref 36.0–46.0)
HCT: 31.2 % — ABNORMAL LOW (ref 36.0–46.0)
Hemoglobin: 10 g/dL — ABNORMAL LOW (ref 12.0–15.0)
Hemoglobin: 9.4 g/dL — ABNORMAL LOW (ref 12.0–15.0)

## 2016-12-31 LAB — HEMOGLOBIN A1C
Hgb A1c MFr Bld: 6.6 % — ABNORMAL HIGH (ref 4.8–5.6)
Mean Plasma Glucose: 143 mg/dL

## 2016-12-31 NOTE — Progress Notes (Signed)
[facility] Progress Note  Melissa Raymond 71 y.o. Mar 16, 1946  CC:   GI bleed   Subjective: Patient had a nonbloody bowel movement today. Denied abdominal pain, nausea and vomiting. Overall doing better. Hemoglobin stable.  ROS : negative for abdominal pain, nausea, vomiting    Objective: Vital signs in last 24 hours: Vitals:   12/31/16 0619 12/31/16 1000  BP: (!) 124/57 (!) 138/58  Pulse:  76  Resp: 20 18  Temp: 98.4 F (36.9 C) 98.8 F (37.1 C)    Physical Exam:  General:  Alert, cooperative, no distress, appears stated age  Head:  Normocephalic, without obvious abnormality, atraumatic  Eyes:  , EOM's intact,   Lungs:   Clear to auscultation bilaterally, respirations unlabored  Heart:  Regular rate and rhythm, S1, S2 normal  Abdomen:   Soft, non-tender, bowel sounds active all four quadrants,  no masses,   Extremities: Extremities normal, atraumatic, no  edema  Pulses: 2+ and symmetric    Lab Results:  Recent Labs  12/30/16 1011 12/31/16 0631  NA 139 140  K 4.2 4.2  CL 106 105  CO2 24 28  GLUCOSE 158* 120*  BUN 16 10  CREATININE 0.65 0.64  CALCIUM 9.0 8.6*    Recent Labs  12/30/16 1011  AST 23  ALT 26  ALKPHOS 44  BILITOT 0.6  PROT 6.7  ALBUMIN 3.8    Recent Labs  12/30/16 1011  12/31/16 0631 12/31/16 1221  WBC 8.3  --  6.3  --   HGB 11.5*  < > 9.6* 10.0*  HCT 36.2  < > 30.1* 31.2*  MCV 92.1  --  93.5  --   PLT 215  --  211  --   < > = values in this interval not displayed. No results for input(s): LABPROT, INR in the last 72 hours.    Assessment/Plan: - Rectal bleeding. Likely post-polypectomy. Improving.   Recommendation --------------------- - Flexible sigmoidoscopy yesterday showed clotted blood in the rectosigmoid colon. Not able to advance scope because of the sharp angulation and rectosigmoid. - Patient's bleeding has resolved. Hemoglobin stable. - Advance diet as tolerated - ok discharge from GI standpoint.  Recurrent nature of lower GI bleed discussed with the patient. Verbalized understanding. - GI will sign off. Follow-up with Dr. Watt Climes in 3-4 weeks  Otis Brace MD, [place] 12/31/2016, 2:07 PM  Pager [phone]  If no answer or after 5 PM call [phone]

## 2016-12-31 NOTE — Discharge Summary (Signed)
Physician Discharge Summary  Melissa Raymond YTK:[id number] DOB: 1946/04/30 DOA: 12/30/2016  PCP: Melissa Smolder, MD  Admit date: 12/30/2016 Discharge date: 12/31/2016   Recommendations for Outpatient Follow-Up:   1. Follow up with Dr. Watt Raymond in 3-4 weeks 2. Cbc 1 week 3. HgbA1C 6.6- outpatient follow up   Discharge Diagnosis:   Principal Problem:   Hematochezia Active Problems:   Hyperlipidemia   Essential hypertension   Discharge disposition:  Home  Discharge Condition: Improved.  Diet recommendation:  Regular.  Wound care: None.   History of Present Illness:   Melissa Raymond is a 71 y.o. female with history of hypertension, hyperlipidemia, presents to the ED secondary to complaints of rectal bleeding that started this morning. Of note, she had a colonoscopy on March 7 with 3 polyps resected in 1 biopsy done. She says she was doing well. Her bowel movements are normal consistency until about 3:00 this morning when she started having some abdominal cramps. She woke up and had 2 bowel movements, but the lights were off and she did not look at her stool. Subsequently, she then woke up with one episode of bowel incontinence, with noted gross bloody stools that were dark mixed with clots, with associated abdominal cramping. Her husband brought her to the ED for further evaluations.  Since then she's had 3 more episodes of hematochezia with mix clots in the ER.  Of note, she endorses mild lightheadedness but no chest pain, no shortness of breath. No nausea, no vomiting, no hematemesis, hemoptysis.  She is currently not on any blood thinners. No prior history of GI bleeding in the past.  She does not smoke, but drank 1 beer last night.     Hospital Course by Problem:   Rectal bleeding s/p polypectomy from a large sigmoid colon resection S/p sigmoidectomy  H/h stable Patient had normal bowel movement today with no further bleeding -GI follow up in 3-4 weeks     Medical  Consultants:    GI   Discharge Exam:   Vitals:   12/31/16 0619 12/31/16 1000  BP: (!) 124/57 (!) 138/58  Pulse:  76  Resp: 20 18  Temp: 98.4 F (36.9 C) 98.8 F (37.1 C)   Vitals:   12/30/16 2119 12/30/16 2200 12/31/16 0619 12/31/16 1000  BP: (!) 132/57  (!) 124/57 (!) 138/58  Pulse:    76  Resp: 18  20 18   Temp: 98.5 F (36.9 C)  98.4 F (36.9 C) 98.8 F (37.1 C)  TempSrc: Oral  Oral Oral  SpO2: 98%  97% 98%  Weight:  66.7 kg (147 lb)      Gen:  NAD- anxious to be d/c;d   The results of significant diagnostics from this hospitalization (including imaging, microbiology, ancillary and laboratory) are listed below for reference.     Procedures and Diagnostic Studies:   No results found.   Labs:   Basic Metabolic Panel:  Recent Labs Lab 12/30/16 1011 12/31/16 0631  NA 139 140  K 4.2 4.2  CL 106 105  CO2 24 28  GLUCOSE 158* 120*  BUN 16 10  CREATININE 0.65 0.64  CALCIUM 9.0 8.6*   GFR Estimated Creatinine Clearance: 62.3 mL/min (by C-G formula based on SCr of 0.64 mg/dL). Liver Function Tests:  Recent Labs Lab 12/30/16 1011  AST 23  ALT 26  ALKPHOS 44  BILITOT 0.6  PROT 6.7  ALBUMIN 3.8   No results for input(s): LIPASE, AMYLASE in the last 168 hours. No results for  input(s): AMMONIA in the last 168 hours. Coagulation profile No results for input(s): INR, PROTIME in the last 168 hours.  CBC:  Recent Labs Lab 12/30/16 1011 12/30/16 1448 12/30/16 1813 12/30/16 2347 12/31/16 0631 12/31/16 1221  WBC 8.3  --   --   --  6.3  --   HGB 11.5* 10.0* 9.2* 9.4* 9.6* 10.0*  HCT 36.2 31.1* 28.7* 28.6* 30.1* 31.2*  MCV 92.1  --   --   --  93.5  --   PLT 215  --   --   --  211  --    Cardiac Enzymes: No results for input(s): CKTOTAL, CKMB, CKMBINDEX, TROPONINI in the last 168 hours. BNP: Invalid input(s): POCBNP CBG: No results for input(s): GLUCAP in the last 168 hours. D-Dimer No results for input(s): DDIMER in the last 72 hours. Hgb  A1c  Recent Labs  12/30/16 1448  HGBA1C 6.6*   Lipid Profile No results for input(s): CHOL, HDL, LDLCALC, TRIG, CHOLHDL, LDLDIRECT in the last 72 hours. Thyroid function studies No results for input(s): TSH, T4TOTAL, T3FREE, THYROIDAB in the last 72 hours.  Invalid input(s): FREET3 Anemia work up No results for input(s): VITAMINB12, FOLATE, FERRITIN, TIBC, IRON, RETICCTPCT in the last 72 hours. Microbiology No results found for this or any previous visit (from the past 240 hour(s)).   Discharge Instructions:    Allergies as of 12/31/2016      Reactions   Penicillins Swelling, Rash   Has patient had a PCN reaction causing immediate rash, facial/tongue/throat swelling, SOB or lightheadedness with hypotension: Yes Has patient had a PCN reaction causing severe rash involving mucus membranes or skin necrosis: No Has patient had a PCN reaction that required hospitalization No Has patient had a PCN reaction occurring within the last 10 years: No If all of the above answers are "NO", then may proceed with Cephalosporin use.      Medication List    TAKE these medications   atorvastatin 80 MG tablet Commonly known as:  LIPITOR Take 1 tablet (80 mg total) by mouth daily.   hydrocortisone cream 1 % Apply 1 application topically 2 (two) times daily.   multivitamin with minerals Tabs tablet Take 1 tablet by mouth daily.      Follow-up Information    Melissa Smolder, MD Follow up in 1 week(s).   Specialty:  Family Medicine Contact information: [place] [address] [phone]            Time coordinating discharge: 35 min  Signed:  JESSICA Alison Raymond   [facility] 12/31/2016, 1:43 PM

## 2016-12-31 NOTE — Progress Notes (Signed)
Pt discharged to home, discharge instructions, medications and follow-up appointments reviewed and discussed with pt, verbalized understanding. Telemetry discontinued, CCMD notified. IV discontinued, catheter intact, site clean and dry. Pt was escorted out of the unit in wheelchair accompanied by husband, took all belongings with her.

## 2017-01-01 DIAGNOSIS — D126 Benign neoplasm of colon, unspecified: Secondary | ICD-10-CM | POA: Diagnosis not present

## 2017-01-01 DIAGNOSIS — K921 Melena: Secondary | ICD-10-CM | POA: Diagnosis not present

## 2017-03-14 DIAGNOSIS — L218 Other seborrheic dermatitis: Secondary | ICD-10-CM | POA: Diagnosis not present

## 2017-03-14 DIAGNOSIS — L720 Epidermal cyst: Secondary | ICD-10-CM | POA: Diagnosis not present

## 2017-04-15 DIAGNOSIS — E119 Type 2 diabetes mellitus without complications: Secondary | ICD-10-CM | POA: Diagnosis not present

## 2017-04-15 DIAGNOSIS — Z6825 Body mass index (BMI) 25.0-25.9, adult: Secondary | ICD-10-CM | POA: Diagnosis not present

## 2017-04-15 DIAGNOSIS — R030 Elevated blood-pressure reading, without diagnosis of hypertension: Secondary | ICD-10-CM | POA: Diagnosis not present

## 2017-04-15 DIAGNOSIS — L723 Sebaceous cyst: Secondary | ICD-10-CM | POA: Diagnosis not present

## 2017-04-15 DIAGNOSIS — E7800 Pure hypercholesterolemia, unspecified: Secondary | ICD-10-CM | POA: Diagnosis not present

## 2017-04-23 ENCOUNTER — Other Ambulatory Visit: Payer: Self-pay | Admitting: Family Medicine

## 2017-04-23 DIAGNOSIS — Z1231 Encounter for screening mammogram for malignant neoplasm of breast: Secondary | ICD-10-CM

## 2017-05-06 ENCOUNTER — Ambulatory Visit
Admission: RE | Admit: 2017-05-06 | Discharge: 2017-05-06 | Disposition: A | Discharge status: Home / Self Care | Payer: Medicare Other | Source: Ambulatory Visit | Attending: Family Medicine | Admitting: Family Medicine

## 2017-05-06 DIAGNOSIS — Z1231 Encounter for screening mammogram for malignant neoplasm of breast: Secondary | ICD-10-CM | POA: Diagnosis not present

## 2017-05-07 ENCOUNTER — Other Ambulatory Visit: Payer: Self-pay | Admitting: Family Medicine

## 2017-05-07 DIAGNOSIS — R928 Other abnormal and inconclusive findings on diagnostic imaging of breast: Secondary | ICD-10-CM

## 2017-05-20 ENCOUNTER — Telehealth: Payer: Self-pay | Admitting: Diagnostic Radiology

## 2017-05-20 ENCOUNTER — Ambulatory Visit
Admission: RE | Admit: 2017-05-20 | Discharge: 2017-05-20 | Disposition: A | Discharge status: Home / Self Care | Payer: Medicare Other | Source: Ambulatory Visit | Attending: Family Medicine | Admitting: Family Medicine

## 2017-05-20 ENCOUNTER — Other Ambulatory Visit: Payer: Self-pay | Admitting: Family Medicine

## 2017-05-20 DIAGNOSIS — R921 Mammographic calcification found on diagnostic imaging of breast: Secondary | ICD-10-CM

## 2017-05-20 DIAGNOSIS — R928 Other abnormal and inconclusive findings on diagnostic imaging of breast: Secondary | ICD-10-CM

## 2017-05-20 DIAGNOSIS — R922 Inconclusive mammogram: Secondary | ICD-10-CM | POA: Diagnosis not present

## 2017-05-20 NOTE — Progress Notes (Signed)
Scheduled for stereo on 05/22/17.

## 2017-05-22 ENCOUNTER — Ambulatory Visit
Admission: RE | Admit: 2017-05-22 | Discharge: 2017-05-22 | Disposition: A | Discharge status: Home / Self Care | Payer: Medicare Other | Source: Ambulatory Visit | Attending: Family Medicine | Admitting: Family Medicine

## 2017-05-22 ENCOUNTER — Other Ambulatory Visit: Payer: Self-pay | Admitting: Family Medicine

## 2017-05-22 DIAGNOSIS — R921 Mammographic calcification found on diagnostic imaging of breast: Secondary | ICD-10-CM | POA: Diagnosis not present

## 2017-05-22 DIAGNOSIS — N6489 Other specified disorders of breast: Secondary | ICD-10-CM | POA: Diagnosis not present

## 2017-06-11 DIAGNOSIS — E119 Type 2 diabetes mellitus without complications: Secondary | ICD-10-CM | POA: Diagnosis not present

## 2017-06-11 DIAGNOSIS — H5203 Hypermetropia, bilateral: Secondary | ICD-10-CM | POA: Diagnosis not present

## 2017-07-01 ENCOUNTER — Encounter: Payer: Self-pay | Admitting: Cardiovascular Disease

## 2017-07-01 ENCOUNTER — Ambulatory Visit (INDEPENDENT_AMBULATORY_CARE_PROVIDER_SITE_OTHER): Payer: Medicare Other | Admitting: Cardiovascular Disease

## 2017-07-01 VITALS — BP 152/90 | HR 72 | Ht 64.5 in | Wt 146.8 lb

## 2017-07-01 DIAGNOSIS — L820 Inflamed seborrheic keratosis: Secondary | ICD-10-CM | POA: Diagnosis not present

## 2017-07-01 DIAGNOSIS — I10 Essential (primary) hypertension: Secondary | ICD-10-CM

## 2017-07-01 DIAGNOSIS — E782 Mixed hyperlipidemia: Secondary | ICD-10-CM | POA: Diagnosis not present

## 2017-07-01 DIAGNOSIS — D485 Neoplasm of uncertain behavior of skin: Secondary | ICD-10-CM | POA: Diagnosis not present

## 2017-07-01 LAB — BASIC METABOLIC PANEL
BUN/Creatinine Ratio: 19 (ref 12–28)
BUN: 12 mg/dL (ref 8–27)
CO2: 25 mmol/L (ref 20–29)
Calcium: 9.8 mg/dL (ref 8.7–10.3)
Chloride: 97 mmol/L (ref 96–106)
Creatinine, Ser: 0.63 mg/dL (ref 0.57–1.00)
GFR calc Af Amer: 105 mL/min/{1.73_m2} (ref >59)
GFR calc non Af Amer: 91 mL/min/{1.73_m2} (ref >59)
Glucose: 128 mg/dL — ABNORMAL HIGH (ref 65–99)
Potassium: 4.4 mmol/L (ref 3.5–5.2)
Sodium: 137 mmol/L (ref 134–144)

## 2017-07-01 LAB — HEPATIC FUNCTION PANEL
ALT: 21 IU/L (ref 0–32)
AST: 17 IU/L (ref 0–40)
Albumin: 4.6 g/dL (ref 3.5–4.8)
Alkaline Phosphatase: 60 IU/L (ref 39–117)
Bilirubin Total: 0.6 mg/dL (ref 0.0–1.2)
Bilirubin, Direct: 0.16 mg/dL (ref 0.00–0.40)
Total Protein: 7 g/dL (ref 6.0–8.5)

## 2017-07-01 LAB — LIPID PANEL
Chol/HDL Ratio: 2.4 ratio (ref 0.0–4.4)
Cholesterol, Total: 203 mg/dL — ABNORMAL HIGH (ref 100–199)
HDL: 85 mg/dL (ref >39)
LDL Calculated: 92 mg/dL (ref 0–99)
Triglycerides: 130 mg/dL (ref 0–149)
VLDL Cholesterol Cal: 26 mg/dL (ref 5–40)

## 2017-07-01 MED ORDER — LOSARTAN POTASSIUM 25 MG PO TABS: 25.0000 mg | ORAL_TABLET | Freq: Every day | ORAL | 3 refills | Status: DC

## 2017-07-01 NOTE — Progress Notes (Signed)
Melissa Raymond Date of Birth  1946-03-09       [facility] Office [address], [address], [place], [place]  [address]   [place], [place]  [address] [place]   Fax  [phone]     Fax [phone]   Previous patient of Dr. Verl Blalock  Problem List: 1. Hyperlipidemia 2.  Diabetes Mellitus 3. Borderline HTN     Melissa Raymond is a 71 yo with hx of hyperlipidemia.   Strong family hx of CVA.   She is healthy.   She does elliptical and walks regularly.  Does yoga regularly.   Has lost 8 lbs in the past 2-3 weeks because her cholesterol level was up.    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 30, 2015:  Melissa Raymond is a 71 yo who we see for hx of hyperlipidemia. Exercising regularly  Did have a back injury but i getting back into working out   Jan. 24, 2017:  Melissa Raymond is seen today for follow up of her hyperlipidemia.  Getting some exercise.  Tolerating the Atorvastatin well.  Under lots of stress.   Jan. 24, 2018:  Under lots of stress.    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   ( [employer] )  [facility].  Sept. 10, 2018:  Melissa Raymond is seen today .   Under lots of stress recently  No CP or dyspnea.    BP has been mostly OK Does not eat salt .    Is now on metformin for new diagnosis of DM     Current Outpatient Prescriptions on File Prior to Visit  Medication Sig Dispense Refill  . atorvastatin (LIPITOR) 80 MG tablet Take 1 tablet (80 mg total) by mouth daily. 90 tablet 3  . hydrocortisone cream 1 % Apply 1 application topically 2 (two) times daily as needed (skin rash).     . Multiple Vitamin (MULTIVITAMIN WITH MINERALS) TABS tablet Take 1 tablet by mouth daily.     No current facility-administered medications on file prior to visit.     Allergies  Allergen Reactions  . Penicillins Swelling and Rash    Has patient had a PCN reaction causing immediate  rash, facial/tongue/throat swelling, SOB or lightheadedness with hypotension: Yes Has patient had a PCN reaction causing severe rash involving mucus membranes or skin necrosis: No Has patient had a PCN reaction that required hospitalization No Has patient had a PCN reaction occurring within the last 10 years: No If all of the above answers are "NO", then may proceed with Cephalosporin use.     Past Medical History:  Diagnosis Date  . Hyperlipidemia     Past Surgical History:  Procedure Laterality Date  . FLEXIBLE SIGMOIDOSCOPY N/A 12/30/2016   Procedure: FLEXIBLE SIGMOIDOSCOPY;  Surgeon: Teena Irani, MD;  Location: [facility] ENDOSCOPY;  Service: Endoscopy;  Laterality: N/A;  . NO PAST SURGERIES      History  Smoking Status  . Never Smoker  Smokeless Tobacco  . Never Used    History  Alcohol use Not on file    Family History  Problem Relation Age of Onset  . Breast cancer Maternal Aunt     Reviw of Systems:  Reviewed in the HPI.  All other systems are negative.  Physical Exam: Blood pressure (!) 152/90, pulse 72, height 5' 4.5" (  1.638 m), weight 146 lb 12.8 oz (66.6 kg). Wt Readings from Last 3 Encounters:  07/01/17 146 lb 12.8 oz (66.6 kg)  12/30/16 147 lb (66.7 kg)  11/14/16 144 lb 12.8 oz (65.7 kg)     General: Well developed, well nourished, in no acute distress.  Head: Normocephalic, atraumatic, sclera non-icteric, mucus membranes are moist,  Neck: Supple. Carotids are 2 + without bruits. No JVD  Lungs: Clear  Heart: RR, normal s1s2 Abdomen: Soft, non-tender, non-distended with normal bowel sounds. Msk:  Strength and tone are normal  Extremities: No clubbing or cyanosis. No edema.  Distal pedal pulses are 2+ and equal  Neuro: CN II - XII intact.  Alert and oriented X 3.  Psych:  Normal   ECG:   Assessment / Plan:   1. Hyperlipidemia-  Check labs today  Continue atrovastatin   2. Hypertension:   BP continues to be mildly elevated.  She has been diagnosed  with DM Will start low dose Losartan 25 mg a day  This should help with her BP and will help prevent proteinuria ( was recently diagnosed with DM)   Advised her to exercise regularly .    Mertie Moores, MD  07/01/2017 9:31 AM    [address],  [place], [place]  [address] Pager [phone] Phone: [phone]; Fax: [phone]

## 2017-07-01 NOTE — Patient Instructions (Addendum)
Medication Instructions:  START Losartan (Cozaar) 25 mg once daily   Labwork: TODAY - cholesterol, liver panel, basic metabolic panel  Your physician recommends that you return for lab work in: 3 weeks for basic metabolic panel (October 2)    Testing/Procedures: None Ordered   Follow-Up: Your physician wants you to follow-up in: 6 months with Dr. Acie Fredrickson.  You will receive a reminder letter in the mail two months in advance. If you don't receive a letter, please call our office to schedule the follow-up appointment.   If you need a refill on your cardiac medications before your next appointment, please call your pharmacy.   Thank you for choosing [facility]! Christen Bame, RN [phone]

## 2017-07-23 ENCOUNTER — Other Ambulatory Visit: Payer: Medicare Other

## 2017-07-23 DIAGNOSIS — E782 Mixed hyperlipidemia: Secondary | ICD-10-CM

## 2017-07-23 DIAGNOSIS — I10 Essential (primary) hypertension: Secondary | ICD-10-CM | POA: Diagnosis not present

## 2017-07-23 LAB — BASIC METABOLIC PANEL
BUN/Creatinine Ratio: 23 (ref 12–28)
BUN: 14 mg/dL (ref 8–27)
CO2: 23 mmol/L (ref 20–29)
Calcium: 9.8 mg/dL (ref 8.7–10.3)
Chloride: 99 mmol/L (ref 96–106)
Creatinine, Ser: 0.62 mg/dL (ref 0.57–1.00)
GFR calc Af Amer: 106 mL/min/{1.73_m2} (ref >59)
GFR calc non Af Amer: 92 mL/min/{1.73_m2} (ref >59)
Glucose: 128 mg/dL — ABNORMAL HIGH (ref 65–99)
Potassium: 4.7 mmol/L (ref 3.5–5.2)
Sodium: 140 mmol/L (ref 134–144)

## 2017-10-24 DIAGNOSIS — I10 Essential (primary) hypertension: Secondary | ICD-10-CM | POA: Diagnosis not present

## 2017-10-24 DIAGNOSIS — E7800 Pure hypercholesterolemia, unspecified: Secondary | ICD-10-CM | POA: Diagnosis not present

## 2017-10-24 DIAGNOSIS — E119 Type 2 diabetes mellitus without complications: Secondary | ICD-10-CM | POA: Diagnosis not present

## 2017-12-17 ENCOUNTER — Other Ambulatory Visit: Payer: Self-pay | Admitting: Cardiovascular Disease

## 2017-12-25 ENCOUNTER — Encounter: Payer: Self-pay | Admitting: Physician Assistant

## 2018-01-01 ENCOUNTER — Encounter: Payer: Self-pay | Admitting: Physician Assistant

## 2018-01-01 ENCOUNTER — Ambulatory Visit (INDEPENDENT_AMBULATORY_CARE_PROVIDER_SITE_OTHER): Payer: Medicare Other | Admitting: Physician Assistant

## 2018-01-01 ENCOUNTER — Other Ambulatory Visit: Payer: Self-pay | Admitting: Physician Assistant

## 2018-01-01 VITALS — BP 138/84 | HR 71 | Ht 64.0 in | Wt 147.2 lb

## 2018-01-01 DIAGNOSIS — I10 Essential (primary) hypertension: Secondary | ICD-10-CM | POA: Diagnosis not present

## 2018-01-01 DIAGNOSIS — R0789 Other chest pain: Secondary | ICD-10-CM

## 2018-01-01 DIAGNOSIS — R9431 Abnormal electrocardiogram [ECG] [EKG]: Secondary | ICD-10-CM | POA: Diagnosis not present

## 2018-01-01 DIAGNOSIS — R0683 Snoring: Secondary | ICD-10-CM | POA: Diagnosis not present

## 2018-01-01 DIAGNOSIS — E782 Mixed hyperlipidemia: Secondary | ICD-10-CM | POA: Diagnosis not present

## 2018-01-01 MED ORDER — METOPROLOL TARTRATE 50 MG PO TABS: 50.0000 mg | ORAL_TABLET | Freq: Once | ORAL | 0 refills | Status: DC

## 2018-01-01 MED ORDER — ASPIRIN EC 81 MG PO TBEC: 81.0000 mg | DELAYED_RELEASE_TABLET | Freq: Every day | ORAL | 3 refills | Status: AC

## 2018-01-01 NOTE — Patient Instructions (Addendum)
Medication Instructions:  Your physician has recommended you make the following change in your medication:  1. Aspirin (81 mg) by mouth once daily.  Labwork: None ordered today  Testing/Procedures: Your provider has recommended you have a coronary ct done.   Please arrive at the [facility] main entrance of [facility] at xx:xx AM (30-45 minutes prior to test start time)  [facility] [address] [place], [place] [address] [phone]  Proceed to the [facility] Radiology Department (First Floor).  Please follow these instructions carefully (unless otherwise directed):  On the Night Before the Test: . Drink plenty of water. . Do not consume any caffeinated/decaffeinated beverages or chocolate 12 hours prior to your test. . Do not take any antihistamines 12 hours prior to your test. . If you take Metformin do not take 24 hours prior to test.  On the Day of the Test: . Drink plenty of water. Do not drink any water within one hour of the test. . Do not eat any food 4 hours prior to the test. . You may take your regular medications prior to the test. . IF NOT ON A BETA BLOCKER - Take 50 mg of lopressor (metoprolol) one hour before the test.  After the Test: . Drink plenty of water. . After receiving IV contrast, you may experience a mild flushed feeling. This is normal. . On occasion, you may experience a mild rash up to 24 hours after the test. This is not dangerous. If this occurs, you can take Benadryl 25 mg and increase your fluid intake. . If you experience trouble breathing, this can be serious. If it is severe call 911 IMMEDIATELY. If it is mild, please call our office. . If you take any of these medications: Glipizide/Metformin, Avandament, Glucavance, please do not take 48 hours after completing test.   Follow-Up: Your physician wants you to follow-up in 6 months with Dr. Acie Fredrickson.  You will receive a reminder letter in the mail two months in  advance. If you don't receive a letter, please call our office to schedule the follow-up appointment.  Any Other Special Instructions Will Be Listed Below (If Applicable).  If you need a refill on your cardiac medications before your next appointment, please call your pharmacy.

## 2018-01-01 NOTE — Progress Notes (Signed)
Cardiology Office Note    Date:  01/01/2018   ID:  Melissa Raymond, DOB 02-18-1946, MRN [id number]  PCP:  Darcus Austin, MD  Cardiologist:  Dr. Acie Fredrickson  Chief Complaint: 6 Months follow up  History of Present Illness:   Melissa Raymond is a 72 y.o. female with hx of HTN, DM and HLD presents for follow up.   Strong family hx of CVA. She was doing well on cardiac stand point when last seen by Dr. Acie Fredrickson 06/2017.  Here today for follow-up.  Patient has intermittent sharp chest pain with and without exertion.  Occurs once or twice per year with self resolution in few minutes.  She goes to [facility] 3 times per week.  He does aerobic exercise, elliptical lifting for approximately 30-60 minutes without chest pain or shortness of breath.  She denies palpitation, orthopnea, PND, syncope, lower extremity edema or dizziness.  Father died of MI.  She endorsed snoring and daytime tiredness.   Past Medical History:  Diagnosis Date  . DM (diabetes mellitus) ([place])   . HTN (hypertension)   . Hyperlipidemia     Past Surgical History:  Procedure Laterality Date  . FLEXIBLE SIGMOIDOSCOPY N/A 12/30/2016   Procedure: FLEXIBLE SIGMOIDOSCOPY;  Surgeon: Teena Irani, MD;  Location: [facility] ENDOSCOPY;  Service: Endoscopy;  Laterality: N/A;  . NO PAST SURGERIES      Current Medications: Prior to Admission medications   Medication Sig Start Date End Date Taking? Authorizing Provider  atorvastatin (LIPITOR) 80 MG tablet TAKE 1 TABLET(80 MG) BY MOUTH DAILY 12/17/17   Nahser, Wonda Cheng, MD  hydrocortisone cream 1 % Apply 1 application topically 2 (two) times daily as needed (skin rash).     [provider]  losartan (COZAAR) 25 MG tablet Take 1 tablet (25 mg total) by mouth daily. 07/01/17 09/29/17  Nahser, Wonda Cheng, MD  metFORMIN (GLUCOPHAGE-XR) 500 MG 24 hr tablet Take 500 mg by mouth every evening. With evening meal 06/18/17   [provider]  Multiple Vitamin (MULTIVITAMIN WITH MINERALS) TABS tablet  Take 1 tablet by mouth daily.    [provider]    Allergies:   Penicillins   Social History   Socioeconomic History  . Marital status: Married    Spouse name: None  . Number of children: None  . Years of education: None  . Highest education level: None  Social Needs  . Financial resource strain: None  . Food insecurity - worry: None  . Food insecurity - inability: None  . Transportation needs - medical: None  . Transportation needs - non-medical: None  Occupational History  . None  Tobacco Use  . Smoking status: Never Smoker  . Smokeless tobacco: Never Used  Substance and Sexual Activity  . Alcohol use: None  . Drug use: No  . Sexual activity: None  Other Topics Concern  . None  Social History Narrative  . None     Family History:  The patient's family history includes Breast cancer in her maternal aunt.   ROS:   Please see the history of present illness.    ROS All other systems reviewed and are negative.   PHYSICAL EXAM:   VS:  BP 138/84   Pulse 71   Ht 5\' 4"  (1.626 m)   Wt 147 lb 3.2 oz (66.8 kg)   SpO2 97%   BMI 25.27 kg/m    GEN: Well nourished, well developed, in no acute distress  HEENT: normal  Neck: no JVD, carotid bruits,  or masses Cardiac: RRR; no murmurs, rubs, or gallops,no edema  Respiratory:  clear to auscultation bilaterally, normal work of breathing GI: soft, nontender, nondistended, + BS MS: no deformity or atrophy  Skin: warm and dry, no rash Neuro:  Alert and Oriented x 3, Strength and sensation are intact Psych: euthymic mood, full affect  Wt Readings from Last 3 Encounters:  01/01/18 147 lb 3.2 oz (66.8 kg)  07/01/17 146 lb 12.8 oz (66.6 kg)  12/30/16 147 lb (66.7 kg)      Studies/Labs Reviewed:   EKG:  EKG is ordered today.  The ekg ordered today demonstrates normal sinus rhythm at rate of 74 bpm and new T wave inversion in lead I and aVL  Recent Labs: 07/01/2017: ALT 21 07/23/2017: BUN 14; Creatinine, Ser 0.62;  Potassium 4.7; Sodium 140   Lipid Panel    Component Value Date/Time   CHOL 203 (H) 07/01/2017 1003   TRIG 130 07/01/2017 1003   HDL 85 07/01/2017 1003   CHOLHDL 2.4 07/01/2017 1003   CHOLHDL 2.3 11/15/2015 0901   VLDL 24 11/15/2015 0901   LDLCALC 92 07/01/2017 1003    Additional studies/ records that were reviewed today include:   As above  ASSESSMENT & PLAN:   1.  Abnormal EKG/atypical chest pain -Patient has atypical chest pain once or twice a year.  She does exercise without any limitation on regular basis.  Noted new T wave inversion in lead I and aVL.  Patient's father died of MI.  Will get coronary CT.  Start aspirin 81 mg.  Recent serum creatinine normal in January 2019.  2. HTN -Stable and well controlled on losartan.  3.  HLD - 07/01/2017: Cholesterol, Total 203; HDL 85; LDL Calculated 92; Triglycerides 130  -Continue statin  4.  DM -Per PCP.  5.  Snoring -Patient has deferred sleep study evaluation until next office visit.    Medication Adjustments/Labs and Tests Ordered: Current medicines are reviewed at length with the patient today.  Concerns regarding medicines are outlined above.  Medication changes, Labs and Tests ordered today are listed in the Patient Instructions below. Patient Instructions  Medication Instructions:  Your physician has recommended you make the following change in your medication:  1. Aspirin (81 mg) by mouth once daily.  Labwork: None ordered today  Testing/Procedures: Your provider has recommended you have a coronary ct done.   Please arrive at the [facility] main entrance of [facility] at xx:xx AM (30-45 minutes prior to test start time)  [facility] [address] [place], [place] [address] [phone]  Proceed to the [facility] Radiology Department (First Floor).  Please follow these instructions carefully (unless otherwise directed):  On the Night Before the Test: . Drink plenty of  water. . Do not consume any caffeinated/decaffeinated beverages or chocolate 12 hours prior to your test. . Do not take any antihistamines 12 hours prior to your test. . If you take Metformin do not take 24 hours prior to test.  On the Day of the Test: . Drink plenty of water. Do not drink any water within one hour of the test. . Do not eat any food 4 hours prior to the test. . You may take your regular medications prior to the test. . IF NOT ON A BETA BLOCKER - Take 50 mg of lopressor (metoprolol) one hour before the test.  After the Test: . Drink plenty of water. . After receiving IV contrast, you may experience a  mild flushed feeling. This is normal. . On occasion, you may experience a mild rash up to 24 hours after the test. This is not dangerous. If this occurs, you can take Benadryl 25 mg and increase your fluid intake. . If you experience trouble breathing, this can be serious. If it is severe call 911 IMMEDIATELY. If it is mild, please call our office. . If you take any of these medications: Glipizide/Metformin, Avandament, Glucavance, please do not take 48 hours after completing test.   Follow-Up: Your physician wants you to follow-up in 6 months with Dr. Acie Fredrickson.  You will receive a reminder letter in the mail two months in advance. If you don't receive a letter, please call our office to schedule the follow-up appointment.  Any Other Special Instructions Will Be Listed Below (If Applicable).  If you need a refill on your cardiac medications before your next appointment, please call your pharmacy.      Jarrett Soho, [place]  01/01/2018 11:29 AM    [facility] [place], [place], [place]  [address] Phone: [phone]; Fax: [phone]

## 2018-02-20 ENCOUNTER — Encounter: Payer: Self-pay | Admitting: Physician Assistant

## 2018-02-24 ENCOUNTER — Encounter: Payer: Self-pay | Admitting: Physician Assistant

## 2018-02-24 ENCOUNTER — Telehealth: Payer: Self-pay | Admitting: Physician Assistant

## 2018-02-24 DIAGNOSIS — Z79899 Other long term (current) drug therapy: Secondary | ICD-10-CM

## 2018-02-24 NOTE — Telephone Encounter (Signed)
New Message    Patient called back 02/24/18 she does not want to have the Cardiac CT done, she would preferred to have the Exercise Tolerance test instead, please call and discuss with patient.

## 2018-02-26 NOTE — Telephone Encounter (Signed)
I discussed patient's concern with Dr. Acie Fredrickson, primary cardiologist for patient. He advised that due to patient's recent change of t wave inversions seen on lateral leads of patient's ekg, patient could not have an ETT. He advised that he thinks the Cardiac CT is the better option for the patient due to possible false positive result on a lexiscan myoview. I left a message for patient to call back to discuss.

## 2018-03-03 ENCOUNTER — Other Ambulatory Visit: Payer: Self-pay | Admitting: Physician Assistant

## 2018-03-03 MED ORDER — METOPROLOL TARTRATE 50 MG PO TABS: 50.0000 mg | ORAL_TABLET | Freq: Once | ORAL | 0 refills | Status: DC

## 2018-03-03 NOTE — Addendum Note (Signed)
Addended by: Gaetano Net on: 03/03/2018 09:05 AM   Modules accepted: Orders

## 2018-03-03 NOTE — Telephone Encounter (Signed)
Please arrive at the [facility] main entrance of [facility] at xx:xx AM (30-45 minutes prior to test start time)  [facility] [place], [place] [address] [phone]  Proceed to the [facility] Radiology Department (First Floor).  Please follow these instructions carefully (unless otherwise directed):  Hold all erectile dysfunction medications at least 48 hours prior to test.  On the Night Before the Test: . Drink plenty of water. . Do not consume any caffeinated/decaffeinated beverages or chocolate 12 hours prior to your test. . Do not take any antihistamines 12 hours prior to your test. . DO NOT TAKE Metformin 24 hours prior to test.  On the Day of the Test: . Drink plenty of water. Do not drink any water within one hour of the test. . Do not eat any food 4 hours prior to the test. . You may take your regular medications prior to the test. . IF NOT ON A BETA BLOCKER - Take 50 mg of lopressor (metoprolol) one hour before the test = THIS HAS BEEN SENT TO YOUR PHARMACY . HOLD Furosemide morning of the test.  After the Test: . Drink plenty of water. . After receiving IV contrast, you may experience a mild flushed feeling. This is normal. . On occasion, you may experience a mild rash up to 24 hours after the test. This is not dangerous. If this occurs, you can take Benadryl 25 mg and increase your fluid intake. . If you experience trouble breathing, this can be serious. If it is severe call 911 IMMEDIATELY. If it is mild, please call our office. . If you take any of these medications: Glipizide/Metformin, Avandament, Glucavance, please do not take 48 hours after completing test.

## 2018-03-03 NOTE — Telephone Encounter (Signed)
Spoke with Melissa Raymond re: CT that has been ordered. Melissa Raymond has been made aware of Dr. Lanny Hurst recommendations, and is ok with having this done. I tried to transfer Melissa Raymond to Mack Guise to get this scheduled but was unavailable at that time. Melissa Raymond stated that she would call back and get it scheduled at her convenience. Melissa Raymond thanked me for the call.

## 2018-03-03 NOTE — Addendum Note (Signed)
Addended by: Gaetano Net on: 03/03/2018 09:53 AM   Modules accepted: Orders

## 2018-03-28 ENCOUNTER — Encounter (INDEPENDENT_AMBULATORY_CARE_PROVIDER_SITE_OTHER): Payer: Self-pay

## 2018-03-28 ENCOUNTER — Other Ambulatory Visit: Payer: Medicare Other

## 2018-03-28 ENCOUNTER — Other Ambulatory Visit: Payer: Medicare Other | Admitting: *Deleted

## 2018-03-28 DIAGNOSIS — Z79899 Other long term (current) drug therapy: Secondary | ICD-10-CM

## 2018-03-28 LAB — BASIC METABOLIC PANEL
BUN/Creatinine Ratio: 16 (ref 12–28)
BUN: 10 mg/dL (ref 8–27)
CO2: 25 mmol/L (ref 20–29)
Calcium: 10.1 mg/dL (ref 8.7–10.3)
Chloride: 98 mmol/L (ref 96–106)
Creatinine, Ser: 0.62 mg/dL (ref 0.57–1.00)
GFR calc Af Amer: 106 mL/min/{1.73_m2} (ref >59)
GFR calc non Af Amer: 92 mL/min/{1.73_m2} (ref >59)
Glucose: 161 mg/dL — ABNORMAL HIGH (ref 65–99)
Potassium: 4.2 mmol/L (ref 3.5–5.2)
Sodium: 138 mmol/L (ref 134–144)

## 2018-03-31 ENCOUNTER — Ambulatory Visit (HOSPITAL_COMMUNITY): Payer: Medicare Other

## 2018-03-31 ENCOUNTER — Telehealth: Payer: Self-pay

## 2018-03-31 ENCOUNTER — Ambulatory Visit (HOSPITAL_COMMUNITY)
Admission: RE | Admit: 2018-03-31 | Discharge: 2018-03-31 | Disposition: A | Discharge status: Home / Self Care | Payer: Medicare Other | Source: Ambulatory Visit | Attending: Physician Assistant | Admitting: Physician Assistant

## 2018-03-31 DIAGNOSIS — R911 Solitary pulmonary nodule: Secondary | ICD-10-CM | POA: Diagnosis not present

## 2018-03-31 DIAGNOSIS — I700 Atherosclerosis of aorta: Secondary | ICD-10-CM | POA: Diagnosis not present

## 2018-03-31 DIAGNOSIS — R9431 Abnormal electrocardiogram [ECG] [EKG]: Secondary | ICD-10-CM | POA: Diagnosis not present

## 2018-03-31 DIAGNOSIS — R0789 Other chest pain: Secondary | ICD-10-CM | POA: Diagnosis not present

## 2018-03-31 DIAGNOSIS — I2510 Atherosclerotic heart disease of native coronary artery without angina pectoris: Secondary | ICD-10-CM | POA: Diagnosis not present

## 2018-03-31 MED ORDER — METOPROLOL TARTRATE 5 MG/5ML IV SOLN
5.0000 mg | INTRAVENOUS | Status: DC | PRN
  Filled 2018-03-31: qty 5

## 2018-03-31 MED ORDER — NITROGLYCERIN 0.4 MG SL SUBL
SUBLINGUAL_TABLET | SUBLINGUAL | Status: AC
  Administered 2018-03-31: 0.8 mg
  Filled 2018-03-31: qty 2

## 2018-03-31 MED ORDER — IOPAMIDOL (ISOVUE-370) INJECTION 76%
INTRAVENOUS | Status: AC
  Filled 2018-03-31: qty 100

## 2018-03-31 MED ORDER — IOPAMIDOL (ISOVUE-370) INJECTION 76%
80.0000 mL | Freq: Once | INTRAVENOUS | Status: AC
  Administered 2018-03-31: 80 mL via INTRAVENOUS

## 2018-03-31 MED ORDER — NITROGLYCERIN 0.4 MG SL SUBL
0.8000 mg | SUBLINGUAL_TABLET | SUBLINGUAL | Status: DC | PRN
  Filled 2018-03-31: qty 25

## 2018-03-31 MED ORDER — METOPROLOL TARTRATE 5 MG/5ML IV SOLN
INTRAVENOUS | Status: AC
  Administered 2018-03-31: 5 mg
  Filled 2018-03-31: qty 20

## 2018-03-31 NOTE — Telephone Encounter (Signed)
Left message to call back for lab results.

## 2018-03-31 NOTE — Telephone Encounter (Signed)
-----   Message from [place], [place] sent at 03/31/2018  8:18 AM EDT ----- Normal kidney function and electrolytes.

## 2018-04-01 ENCOUNTER — Telehealth: Payer: Self-pay | Admitting: *Deleted

## 2018-04-01 ENCOUNTER — Telehealth: Payer: Self-pay

## 2018-04-01 NOTE — Telephone Encounter (Signed)
Follow Up: ° ° ° °Returning your call from yesterday. °

## 2018-04-01 NOTE — Telephone Encounter (Signed)
Patient is aware and agreeable to normal kidney function and electrolytes

## 2018-04-01 NOTE — Telephone Encounter (Signed)
-----   Message from [place], [place] sent at 04/01/2018  3:51 PM EDT ----- Mild non obstructive plaque. Continue lipitor. Add aspirin 81mg  qd.

## 2018-04-28 DIAGNOSIS — I10 Essential (primary) hypertension: Secondary | ICD-10-CM | POA: Diagnosis not present

## 2018-04-28 DIAGNOSIS — E119 Type 2 diabetes mellitus without complications: Secondary | ICD-10-CM | POA: Diagnosis not present

## 2018-04-28 DIAGNOSIS — E7800 Pure hypercholesterolemia, unspecified: Secondary | ICD-10-CM | POA: Diagnosis not present

## 2018-04-28 DIAGNOSIS — F329 Major depressive disorder, single episode, unspecified: Secondary | ICD-10-CM | POA: Diagnosis not present

## 2018-05-01 ENCOUNTER — Other Ambulatory Visit: Payer: Self-pay | Admitting: Cardiovascular Disease

## 2018-05-06 ENCOUNTER — Other Ambulatory Visit: Payer: Self-pay | Admitting: Family Medicine

## 2018-05-08 ENCOUNTER — Other Ambulatory Visit: Payer: Self-pay | Admitting: Family Medicine

## 2018-05-08 DIAGNOSIS — R911 Solitary pulmonary nodule: Secondary | ICD-10-CM

## 2018-05-16 ENCOUNTER — Other Ambulatory Visit: Payer: Self-pay | Admitting: Family Medicine

## 2018-05-16 DIAGNOSIS — Z1231 Encounter for screening mammogram for malignant neoplasm of breast: Secondary | ICD-10-CM

## 2018-06-09 ENCOUNTER — Ambulatory Visit
Admission: RE | Admit: 2018-06-09 | Discharge: 2018-06-09 | Disposition: A | Discharge status: Home / Self Care | Payer: Medicare Other | Source: Ambulatory Visit | Attending: Family Medicine | Admitting: Family Medicine

## 2018-06-09 DIAGNOSIS — Z1231 Encounter for screening mammogram for malignant neoplasm of breast: Secondary | ICD-10-CM | POA: Diagnosis not present

## 2018-06-10 ENCOUNTER — Other Ambulatory Visit: Payer: Self-pay | Admitting: Family Medicine

## 2018-06-10 DIAGNOSIS — R928 Other abnormal and inconclusive findings on diagnostic imaging of breast: Secondary | ICD-10-CM

## 2018-06-16 ENCOUNTER — Ambulatory Visit
Admission: RE | Admit: 2018-06-16 | Discharge: 2018-06-16 | Disposition: A | Discharge status: Home / Self Care | Payer: Medicare Other | Source: Ambulatory Visit | Attending: Family Medicine | Admitting: Family Medicine

## 2018-06-16 ENCOUNTER — Other Ambulatory Visit: Payer: Self-pay | Admitting: Family Medicine

## 2018-06-16 DIAGNOSIS — R928 Other abnormal and inconclusive findings on diagnostic imaging of breast: Secondary | ICD-10-CM

## 2018-06-16 DIAGNOSIS — R922 Inconclusive mammogram: Secondary | ICD-10-CM | POA: Diagnosis not present

## 2018-06-16 DIAGNOSIS — N6489 Other specified disorders of breast: Secondary | ICD-10-CM | POA: Diagnosis not present

## 2018-06-17 ENCOUNTER — Ambulatory Visit
Admission: RE | Admit: 2018-06-17 | Discharge: 2018-06-17 | Disposition: A | Discharge status: Home / Self Care | Payer: Medicare Other | Source: Ambulatory Visit | Attending: Family Medicine | Admitting: Family Medicine

## 2018-06-17 DIAGNOSIS — N6489 Other specified disorders of breast: Secondary | ICD-10-CM

## 2018-06-17 DIAGNOSIS — N6311 Unspecified lump in the right breast, upper outer quadrant: Secondary | ICD-10-CM | POA: Diagnosis not present

## 2018-06-17 DIAGNOSIS — N6091 Unspecified benign mammary dysplasia of right breast: Secondary | ICD-10-CM | POA: Diagnosis not present

## 2018-06-20 DIAGNOSIS — E119 Type 2 diabetes mellitus without complications: Secondary | ICD-10-CM | POA: Diagnosis not present

## 2018-06-20 DIAGNOSIS — H5203 Hypermetropia, bilateral: Secondary | ICD-10-CM | POA: Diagnosis not present

## 2018-06-27 ENCOUNTER — Ambulatory Visit: Payer: Self-pay | Admitting: Surgery

## 2018-06-27 DIAGNOSIS — N6310 Unspecified lump in the right breast, unspecified quadrant: Secondary | ICD-10-CM | POA: Diagnosis not present

## 2018-06-27 NOTE — H&P (Signed)
Melissa Raymond Documented: 06/27/2018 9:24 AM Location: [facility] Patient #: [id number] DOB: [birth year] Married / Language: English / Race: White Female  History of Present Illness Melissa Raymond A. Melissa Peek MD; 06/27/2018 12:02 PM) Patient words: Patient sent at the request of Dr. Radford Pax for abnormal screening mammogram. Patient with recent screening mammogram which showed an area of distortion in the right breast. Patient denies history of breast pain, nipple discharge or other abnormality with either breast. No redness, warmth, drainage, or significant pain. No history of breast cancer.                            CLINICAL DATA: 72 year old patient recalled from recent screening mammogram for possible architectural distortion in the right breast. History of stereotactic biopsy of right breast calcifications in August 2018, with benign pathology showing fibroadenomatoid change. The patient is asymptomatic. She has no history of breast surgery. EXAM: DIGITAL DIAGNOSTIC RIGHT MAMMOGRAM WITH CAD AND TOMO ULTRASOUND RIGHT BREAST COMPARISON: Previous exam(s). ACR Breast Density Category c: The breast tissue is heterogeneously dense, which may obscure small masses. FINDINGS: Additional views of the right breast today confirm an area of architectural distortion the posterior third of the upper outer right breast. Mammographic images were processed with CAD. On physical exam, no mass is palpated in the upper-outer quadrant of the right breast. Targeted ultrasound is performed, showing heterogeneously dense breast parenchyma. No definite sonographic correlate to the distortion, reproducible in orthogonal planes, is identified. Ultrasound the right axilla is negative for lymphadenopathy. IMPRESSION: Architectural distortion posterior upper outer quadrant of the right breast. The abnormality is best visualized on mammography. Right axillary lymph nodes appear  within normal limits. RECOMMENDATION: Stereotactic right breast biopsy is recommended and is being scheduled for the patient. I have discussed the findings and recommendations with the patient. Results were also provided in writing at the conclusion of the visit. If applicable, a reminder letter will be sent to the patient regarding the next appointment. BI-RADS CATEGORY 4: Suspicious. Electronically Signed By: Curlene Dolphin M.D. On: 06/16/2018 09:45  US BREAST LTD UNI RIGHT INC AXILLA (Order [id number]) - Reflex for Order [id number] Study Result  CLINICAL DATA: 72 year old patient recalled from recent screening mammogram for possible architectural distortion in the right breast. History of stereotactic biopsy of right breast calcifications in August 2018, with benign pathology showing fibroadenomatoid change. The patient is asymptomatic. She has no history of breast surgery. EXAM: DIGITAL DIAGNOSTIC RIGHT MAMMOGRAM WITH CAD AND TOMO ULTRASOUND RIGHT BREAST COMPARISON: Previous exam(s). ACR Breast Density Category c: The breast tissue is heterogeneously dense, which may obscure small masses. FINDINGS: Additional views of the right breast today confirm an area of architectural distortion the posterior third of the upper outer right breast. Mammographic images were processed with CAD. On physical exam, no mass is palpated in the upper-outer quadrant of the right breast. Targeted ultrasound is performed, showing heterogeneously dense breast parenchyma. No definite sonographic correlate to the distortion, reproducible in orthogonal planes, is identified. Ultrasound the right axilla is negative for lymphadenopathy. IMPRESSION: Architectural distortion posterior upper outer quadrant of the right breast. The abnormality is best visualized on mammography. Right axillary lymph nodes appear within normal limits. RECOMMENDATION: Stereotactic right breast biopsy is recommended and is  being scheduled for the patient. I have discussed the findings and recommendations with the patient. Results were also provided in writing at the conclusion of the visit. If applicable, a reminder letter will be sent to the  patient regarding the next appointment. BI-RADS CATEGORY 4: Suspicious. Electronically Signed By: Curlene Dolphin M.D. On: 06/16/2018 09:45               Diagnosis Breast, right, needle core biopsy, upper outer - COMPLEX SCLEROSING LESION WITH CALCIFICATIONS. - USUAL DUCTAL HYPERPLASIA. Microscopic Comment The case was called to [facility] on 06/18/2018. Vicente Males MD Pathologist, Electronic Signature (Case signed 06/18/2018) Specimen Gross and Clinical Information.  The patient is a 72 year old female.   Allergies Sabino Gasser; 06/27/2018 9:25 AM) Penicillins Hives. Allergies Reconciled  Medication History Sabino Gasser; 06/27/2018 9:25 AM) Losartan Potassium (25MG  Tablet, Oral) Active. metFORMIN HCl ER (500MG  Tablet ER 24HR, Oral) Active. OneTouch UltraSoft Lancets Active. OneTouch Verio (In Vitro) Active. Sulfamethoxazole-Trimethoprim (800-160MG  Tablet, Oral) Active. Lipitor (80MG  Tablet, Oral) Active. Medications Reconciled    Vitals Sabino Gasser; 06/27/2018 9:26 AM) 06/27/2018 9:25 AM Weight: 147.5 lb Height: 64.5in Body Surface Area: 1.73 m Body Mass Index: 24.93 kg/m  Temp.: 98.34F(Oral)  Pulse: 75 (Regular)  BP: 128/82 (Sitting, Left Arm, Standard)      Physical Exam (Mescal Flinchbaugh A. Sherine Cortese MD; 06/27/2018 12:02 PM)  General Mental Status-Alert. General Appearance-Consistent with stated age. Hydration-Well hydrated. Voice-Normal.  Head and Neck Head-normocephalic, atraumatic with no lesions or palpable masses. Trachea-midline. Thyroid Gland Characteristics - normal size and consistency.  Chest and Lung Exam Chest and lung exam reveals -quiet, even and easy  respiratory effort with no use of accessory muscles and on auscultation, normal breath sounds, no adventitious sounds and normal vocal resonance. Inspection Chest Wall - Normal. Back - normal.  Breast Breast - Left-Symmetric, Non Tender, No Biopsy scars, no Dimpling, No Inflammation, No Lumpectomy scars, No Mastectomy scars, No Peau d' Orange. Breast - Right-Symmetric, Non Tender, No Biopsy scars, no Dimpling, No Inflammation, No Lumpectomy scars, No Mastectomy scars, No Peau d' Orange. Breast Lump-No Palpable Breast Mass.  Cardiovascular Cardiovascular examination reveals -normal heart sounds, regular rate and rhythm with no murmurs and normal pedal pulses bilaterally.  Neurologic Neurologic evaluation reveals -alert and oriented x 3 with no impairment of recent or remote memory. Mental Status-Normal.  Musculoskeletal Normal Exam - Left-Upper Extremity Strength Normal and Lower Extremity Strength Normal. Normal Exam - Right-Upper Extremity Strength Normal and Lower Extremity Strength Normal.  Lymphatic Head & Neck  General Head & Neck Lymphatics: Bilateral - Description - Normal. Axillary  General Axillary Region: Bilateral - Description - Normal. Tenderness - Non Tender.    Assessment & Plan (Reizel Calzada A. Tayton Decaire MD; 06/27/2018 12:03 PM)  MASS OF RIGHT BREAST ON MAMMOGRAM (N63.10) Impression: CSL  Discussed the potential risk of malignancy being up to 1 and 5 patient's. Discussed observation as well as right breast lumpectomy with C localization the pros and cons of this.  Patient is opted for right breast lumpectomy. Discussed right breast lumpectomy received localization versus observation with six-month follow-up. The pros and cons were discussed. Potential complications of both discuss long-term expectations. She is opted for right breast localized lumpectomy. Risk of lumpectomy include bleeding, infection, seroma, more surgery, use of seed/wire, wound care,  cosmetic deformity and the need for other treatments, death , blood clots, death. Pt agrees to proceed.  Current Plans Pt Education - [facility] Free Text Education/Instructions: discussed with patient and provided information. Pt Education - [facility] Breast Biopsy HCI: discussed with patient and provided information. The anatomy and the physiology was discussed. The pathophysiology and natural history of the disease was discussed. Options were discussed and recommendations were  made. Technique, risks, benefits, & alternatives were discussed. Risks such as stroke, heart attack, bleeding, seroma infection, death, cosmetic deformity and other risks discussed. Questions answered. The patient agrees to proceed.

## 2018-07-04 ENCOUNTER — Other Ambulatory Visit: Payer: Self-pay | Admitting: Surgery

## 2018-07-04 DIAGNOSIS — N6310 Unspecified lump in the right breast, unspecified quadrant: Secondary | ICD-10-CM

## 2018-08-04 ENCOUNTER — Other Ambulatory Visit: Payer: Self-pay | Admitting: Cardiovascular Disease

## 2018-08-04 ENCOUNTER — Other Ambulatory Visit: Payer: Self-pay

## 2018-08-04 ENCOUNTER — Ambulatory Visit
Admission: RE | Admit: 2018-08-04 | Discharge: 2018-08-04 | Disposition: A | Discharge status: Home / Self Care | Payer: Medicare Other | Source: Ambulatory Visit | Attending: Surgery | Admitting: Surgery

## 2018-08-04 ENCOUNTER — Encounter (HOSPITAL_BASED_OUTPATIENT_CLINIC_OR_DEPARTMENT_OTHER)
Admission: RE | Admit: 2018-08-04 | Discharge: 2018-08-04 | Disposition: A | Discharge status: Home / Self Care | Payer: Medicare Other | Source: Ambulatory Visit | Attending: Surgery | Admitting: Surgery

## 2018-08-04 ENCOUNTER — Encounter (HOSPITAL_BASED_OUTPATIENT_CLINIC_OR_DEPARTMENT_OTHER): Payer: Self-pay | Admitting: *Deleted

## 2018-08-04 DIAGNOSIS — Z01812 Encounter for preprocedural laboratory examination: Secondary | ICD-10-CM

## 2018-08-04 DIAGNOSIS — E119 Type 2 diabetes mellitus without complications: Secondary | ICD-10-CM | POA: Diagnosis not present

## 2018-08-04 DIAGNOSIS — N6011 Diffuse cystic mastopathy of right breast: Secondary | ICD-10-CM | POA: Diagnosis not present

## 2018-08-04 DIAGNOSIS — Z880 Allergy status to penicillin: Secondary | ICD-10-CM | POA: Diagnosis not present

## 2018-08-04 DIAGNOSIS — N6021 Fibroadenosis of right breast: Secondary | ICD-10-CM | POA: Diagnosis not present

## 2018-08-04 DIAGNOSIS — R928 Other abnormal and inconclusive findings on diagnostic imaging of breast: Secondary | ICD-10-CM | POA: Diagnosis not present

## 2018-08-04 DIAGNOSIS — N6310 Unspecified lump in the right breast, unspecified quadrant: Secondary | ICD-10-CM

## 2018-08-04 DIAGNOSIS — I10 Essential (primary) hypertension: Secondary | ICD-10-CM | POA: Diagnosis not present

## 2018-08-04 DIAGNOSIS — Z7984 Long term (current) use of oral hypoglycemic drugs: Secondary | ICD-10-CM | POA: Diagnosis not present

## 2018-08-04 DIAGNOSIS — Z79899 Other long term (current) drug therapy: Secondary | ICD-10-CM | POA: Diagnosis not present

## 2018-08-04 DIAGNOSIS — D241 Benign neoplasm of right breast: Secondary | ICD-10-CM | POA: Diagnosis not present

## 2018-08-04 DIAGNOSIS — Z7982 Long term (current) use of aspirin: Secondary | ICD-10-CM | POA: Diagnosis not present

## 2018-08-04 LAB — POCT I-STAT, CHEM 8
BUN: 14 mg/dL (ref 8–23)
Calcium, Ion: 1.21 mmol/L (ref 1.15–1.40)
Chloride: 101 mmol/L (ref 98–111)
Creatinine, Ser: 0.8 mg/dL (ref 0.44–1.00)
Glucose, Bld: 123 mg/dL — ABNORMAL HIGH (ref 70–99)
HCT: 41 % (ref 36.0–46.0)
Hemoglobin: 13.9 g/dL (ref 12.0–15.0)
Potassium: 4 mmol/L (ref 3.5–5.1)
Sodium: 138 mmol/L (ref 135–145)
TCO2: 25 mmol/L (ref 22–32)

## 2018-08-04 NOTE — Progress Notes (Signed)
Patient given pre surgery drink (ENSURE) and instructions to drink at 0430 am on DOS.  Patient also given medical soap (CHG) and given instructions on usage.  Patient verbalized understanding for both.    Patient had ISTAT drawn today per Elise Benne, RN.

## 2018-08-06 NOTE — Anesthesia Preprocedure Evaluation (Addendum)
Anesthesia Evaluation  Patient identified by MRN, date of birth, ID band Patient awake    Reviewed: Allergy & Precautions, H&P , NPO status , Patient's Chart, lab work & pertinent test results, reviewed documented beta blocker date and time   Airway Mallampati: II  TM Distance: >3 FB Neck ROM: full    Dental no notable dental hx.    Pulmonary neg pulmonary ROS,    Pulmonary exam normal breath sounds clear to auscultation       Cardiovascular Exercise Tolerance: Good hypertension, negative cardio ROS   Rhythm:regular Rate:Normal     Neuro/Psych negative neurological ROS  negative psych ROS   GI/Hepatic negative GI ROS, Neg liver ROS,   Endo/Other  negative endocrine ROSdiabetes, Oral Hypoglycemic Agents  Renal/GU negative Renal ROS  negative genitourinary   Musculoskeletal   Abdominal   Peds  Hematology negative hematology ROS (+)   Anesthesia Other Findings   Reproductive/Obstetrics negative OB ROS                            Anesthesia Physical Anesthesia Plan  ASA: II  Anesthesia Plan: General   Post-op Pain Management:    Induction: Intravenous  PONV Risk Score and Plan: 3 and Ondansetron and Treatment may vary due to age or medical condition  Airway Management Planned: Oral ETT and LMA  Additional Equipment:   Intra-op Plan:   Post-operative Plan: Extubation in OR  Informed Consent: I have reviewed the patients History and Physical, chart, labs and discussed the procedure including the risks, benefits and alternatives for the proposed anesthesia with the patient or authorized representative who has indicated his/her understanding and acceptance.   Dental Advisory Given  Plan Discussed with: CRNA, Anesthesiologist and Surgeon  Anesthesia Plan Comments: (  )       Anesthesia Quick Evaluation

## 2018-08-07 ENCOUNTER — Ambulatory Visit (HOSPITAL_BASED_OUTPATIENT_CLINIC_OR_DEPARTMENT_OTHER)
Admission: RE | Admit: 2018-08-07 | Discharge: 2018-08-07 | Disposition: A | Discharge status: Home / Self Care | Payer: Medicare Other | Source: Ambulatory Visit | Attending: Surgery | Admitting: Surgery

## 2018-08-07 ENCOUNTER — Ambulatory Visit (HOSPITAL_BASED_OUTPATIENT_CLINIC_OR_DEPARTMENT_OTHER): Payer: Medicare Other | Admitting: Anesthesiology

## 2018-08-07 ENCOUNTER — Other Ambulatory Visit: Payer: Self-pay

## 2018-08-07 ENCOUNTER — Ambulatory Visit
Admission: RE | Admit: 2018-08-07 | Discharge: 2018-08-07 | Disposition: A | Discharge status: Home / Self Care | Payer: Medicare Other | Source: Ambulatory Visit | Attending: Surgery | Admitting: Surgery

## 2018-08-07 ENCOUNTER — Encounter (HOSPITAL_BASED_OUTPATIENT_CLINIC_OR_DEPARTMENT_OTHER): Payer: Self-pay | Admitting: Anesthesiology

## 2018-08-07 ENCOUNTER — Encounter (HOSPITAL_BASED_OUTPATIENT_CLINIC_OR_DEPARTMENT_OTHER)
Admission: RE | Disposition: A | Discharge status: Home / Self Care | Payer: Self-pay | Source: Ambulatory Visit | Attending: Surgery

## 2018-08-07 DIAGNOSIS — N6011 Diffuse cystic mastopathy of right breast: Secondary | ICD-10-CM | POA: Insufficient documentation

## 2018-08-07 DIAGNOSIS — N6489 Other specified disorders of breast: Secondary | ICD-10-CM | POA: Diagnosis not present

## 2018-08-07 DIAGNOSIS — R920 Mammographic microcalcification found on diagnostic imaging of breast: Secondary | ICD-10-CM | POA: Diagnosis not present

## 2018-08-07 DIAGNOSIS — N6021 Fibroadenosis of right breast: Secondary | ICD-10-CM | POA: Diagnosis not present

## 2018-08-07 DIAGNOSIS — N6310 Unspecified lump in the right breast, unspecified quadrant: Secondary | ICD-10-CM

## 2018-08-07 DIAGNOSIS — D241 Benign neoplasm of right breast: Secondary | ICD-10-CM | POA: Insufficient documentation

## 2018-08-07 DIAGNOSIS — Z7982 Long term (current) use of aspirin: Secondary | ICD-10-CM | POA: Insufficient documentation

## 2018-08-07 DIAGNOSIS — R928 Other abnormal and inconclusive findings on diagnostic imaging of breast: Secondary | ICD-10-CM | POA: Diagnosis not present

## 2018-08-07 DIAGNOSIS — E119 Type 2 diabetes mellitus without complications: Secondary | ICD-10-CM | POA: Diagnosis not present

## 2018-08-07 DIAGNOSIS — Z7984 Long term (current) use of oral hypoglycemic drugs: Secondary | ICD-10-CM | POA: Insufficient documentation

## 2018-08-07 DIAGNOSIS — Z880 Allergy status to penicillin: Secondary | ICD-10-CM | POA: Diagnosis not present

## 2018-08-07 DIAGNOSIS — I10 Essential (primary) hypertension: Secondary | ICD-10-CM | POA: Diagnosis not present

## 2018-08-07 DIAGNOSIS — Z79899 Other long term (current) drug therapy: Secondary | ICD-10-CM | POA: Insufficient documentation

## 2018-08-07 HISTORY — DX: Unspecified osteoarthritis, unspecified site: M19.90

## 2018-08-07 HISTORY — DX: Unspecified lump in the right breast, unspecified quadrant: N63.10

## 2018-08-07 HISTORY — PX: BREAST LUMPECTOMY WITH RADIOACTIVE SEED LOCALIZATION: SHX6424

## 2018-08-07 HISTORY — PX: BREAST EXCISIONAL BIOPSY: SUR124

## 2018-08-07 LAB — GLUCOSE, CAPILLARY
Glucose-Capillary: 137 mg/dL — ABNORMAL HIGH (ref 70–99)
Glucose-Capillary: 108 mg/dL — ABNORMAL HIGH (ref 70–99)

## 2018-08-07 SURGERY — BREAST LUMPECTOMY WITH RADIOACTIVE SEED LOCALIZATION
Anesthesia: General | Site: Breast | Laterality: Right

## 2018-08-07 MED ORDER — FENTANYL CITRATE (PF) 100 MCG/2ML IJ SOLN
INTRAMUSCULAR | Status: AC
  Filled 2018-08-07: qty 2

## 2018-08-07 MED ORDER — LIDOCAINE 2% (20 MG/ML) 5 ML SYRINGE
INTRAMUSCULAR | Status: DC | PRN
  Administered 2018-08-07: 50 mg via INTRAVENOUS

## 2018-08-07 MED ORDER — SCOPOLAMINE 1 MG/3DAYS TD PT72: 1.0000 | MEDICATED_PATCH | Freq: Once | TRANSDERMAL | Status: DC | PRN

## 2018-08-07 MED ORDER — FENTANYL CITRATE (PF) 100 MCG/2ML IJ SOLN
50.0000 ug | INTRAMUSCULAR | Status: DC | PRN
  Administered 2018-08-07: 100 ug via INTRAVENOUS

## 2018-08-07 MED ORDER — MIDAZOLAM HCL 2 MG/2ML IJ SOLN
INTRAMUSCULAR | Status: AC
  Filled 2018-08-07: qty 2

## 2018-08-07 MED ORDER — LIDOCAINE 2% (20 MG/ML) 5 ML SYRINGE
INTRAMUSCULAR | Status: AC
  Filled 2018-08-07: qty 5

## 2018-08-07 MED ORDER — PROPOFOL 500 MG/50ML IV EMUL
INTRAVENOUS | Status: AC
  Filled 2018-08-07: qty 50

## 2018-08-07 MED ORDER — SODIUM CHLORIDE 0.9 % IJ SOLN
INTRAMUSCULAR | Status: AC
  Filled 2018-08-07: qty 10

## 2018-08-07 MED ORDER — BUPIVACAINE-EPINEPHRINE 0.25% -1:200000 IJ SOLN
INTRAMUSCULAR | Status: AC
  Filled 2018-08-07: qty 2

## 2018-08-07 MED ORDER — CHLORHEXIDINE GLUCONATE CLOTH 2 % EX PADS: 6.0000 | MEDICATED_PAD | Freq: Once | CUTANEOUS | Status: DC

## 2018-08-07 MED ORDER — FENTANYL CITRATE (PF) 100 MCG/2ML IJ SOLN: 25.0000 ug | INTRAMUSCULAR | Status: DC | PRN

## 2018-08-07 MED ORDER — DEXAMETHASONE SODIUM PHOSPHATE 10 MG/ML IJ SOLN
INTRAMUSCULAR | Status: AC
  Filled 2018-08-07: qty 1

## 2018-08-07 MED ORDER — OXYCODONE HCL 5 MG PO TABS
5.0000 mg | ORAL_TABLET | Freq: Once | ORAL | Status: AC
  Administered 2018-08-07: 5 mg via ORAL

## 2018-08-07 MED ORDER — ONDANSETRON HCL 4 MG/2ML IJ SOLN
INTRAMUSCULAR | Status: AC
  Filled 2018-08-07: qty 2

## 2018-08-07 MED ORDER — BUPIVACAINE LIPOSOME 1.3 % IJ SUSP
INTRAMUSCULAR | Status: AC
  Filled 2018-08-07: qty 20

## 2018-08-07 MED ORDER — ACETAMINOPHEN 500 MG PO TABS
1000.0000 mg | ORAL_TABLET | ORAL | Status: AC
  Administered 2018-08-07: 1000 mg via ORAL

## 2018-08-07 MED ORDER — CELECOXIB 200 MG PO CAPS
ORAL_CAPSULE | ORAL | Status: AC
  Filled 2018-08-07: qty 1

## 2018-08-07 MED ORDER — CLINDAMYCIN PHOSPHATE 900 MG/50ML IV SOLN
900.0000 mg | INTRAVENOUS | Status: AC
  Administered 2018-08-07 (×2): 900 mg via INTRAVENOUS

## 2018-08-07 MED ORDER — OXYCODONE HCL 5 MG PO TABS
ORAL_TABLET | ORAL | Status: AC
  Filled 2018-08-07: qty 1

## 2018-08-07 MED ORDER — GABAPENTIN 300 MG PO CAPS
300.0000 mg | ORAL_CAPSULE | ORAL | Status: AC
  Administered 2018-08-07: 300 mg via ORAL

## 2018-08-07 MED ORDER — ONDANSETRON HCL 4 MG/2ML IJ SOLN
INTRAMUSCULAR | Status: DC | PRN
  Administered 2018-08-07: 4 mg via INTRAVENOUS

## 2018-08-07 MED ORDER — MEPERIDINE HCL 25 MG/ML IJ SOLN: 6.2500 mg | INTRAMUSCULAR | Status: DC | PRN

## 2018-08-07 MED ORDER — PROPOFOL 10 MG/ML IV BOLUS
INTRAVENOUS | Status: DC | PRN
  Administered 2018-08-07: 150 mg via INTRAVENOUS

## 2018-08-07 MED ORDER — LACTATED RINGERS IV SOLN
INTRAVENOUS | Status: DC
  Administered 2018-08-07: 07:00:00 via INTRAVENOUS

## 2018-08-07 MED ORDER — CELECOXIB 200 MG PO CAPS
200.0000 mg | ORAL_CAPSULE | ORAL | Status: AC
  Administered 2018-08-07: 200 mg via ORAL

## 2018-08-07 MED ORDER — IBUPROFEN 800 MG PO TABS: 800.0000 mg | ORAL_TABLET | Freq: Three times a day (TID) | ORAL | 0 refills | Status: DC | PRN

## 2018-08-07 MED ORDER — ACETAMINOPHEN 500 MG PO TABS
ORAL_TABLET | ORAL | Status: AC
  Filled 2018-08-07: qty 2

## 2018-08-07 MED ORDER — GABAPENTIN 300 MG PO CAPS
ORAL_CAPSULE | ORAL | Status: AC
  Filled 2018-08-07: qty 1

## 2018-08-07 MED ORDER — DEXAMETHASONE SODIUM PHOSPHATE 4 MG/ML IJ SOLN
INTRAMUSCULAR | Status: DC | PRN
  Administered 2018-08-07: 10 mg via INTRAVENOUS

## 2018-08-07 MED ORDER — OXYCODONE HCL 5 MG PO TABS: 5.0000 mg | ORAL_TABLET | Freq: Four times a day (QID) | ORAL | 0 refills | Status: DC | PRN

## 2018-08-07 MED ORDER — CLINDAMYCIN PHOSPHATE 900 MG/50ML IV SOLN
INTRAVENOUS | Status: AC
  Filled 2018-08-07: qty 50

## 2018-08-07 MED ORDER — MIDAZOLAM HCL 2 MG/2ML IJ SOLN
1.0000 mg | INTRAMUSCULAR | Status: DC | PRN
  Administered 2018-08-07: 2 mg via INTRAVENOUS

## 2018-08-07 MED ORDER — BUPIVACAINE-EPINEPHRINE (PF) 0.25% -1:200000 IJ SOLN
INTRAMUSCULAR | Status: DC | PRN
  Administered 2018-08-07: 20 mL via PERINEURAL

## 2018-08-07 SURGICAL SUPPLY — 50 items
ADH SKN CLS APL DERMABOND .7 (GAUZE/BANDAGES/DRESSINGS) ×1
APPLIER CLIP 9.375 MED OPEN (MISCELLANEOUS)
APR CLP MED 9.3 20 MLT OPN (MISCELLANEOUS)
BINDER BREAST LRG (GAUZE/BANDAGES/DRESSINGS) ×1 IMPLANT
BINDER BREAST MEDIUM (GAUZE/BANDAGES/DRESSINGS) IMPLANT
BINDER BREAST XLRG (GAUZE/BANDAGES/DRESSINGS) IMPLANT
BINDER BREAST XXLRG (GAUZE/BANDAGES/DRESSINGS) IMPLANT
BLADE SURG 15 STRL LF DISP TIS (BLADE) ×1 IMPLANT
BLADE SURG 15 STRL SS (BLADE) ×2
CANISTER SUC SOCK COL 7IN (MISCELLANEOUS) IMPLANT
CANISTER SUCT 1200ML W/VALVE (MISCELLANEOUS) IMPLANT
CHLORAPREP W/TINT 26ML (MISCELLANEOUS) ×2 IMPLANT
CLIP APPLIE 9.375 MED OPEN (MISCELLANEOUS) IMPLANT
COVER BACK TABLE 60X90IN (DRAPES) ×2 IMPLANT
COVER MAYO STAND STRL (DRAPES) ×2 IMPLANT
COVER PROBE W GEL 5X96 (DRAPES) ×2 IMPLANT
COVER WAND RF STERILE (DRAPES) IMPLANT
DECANTER SPIKE VIAL GLASS SM (MISCELLANEOUS) IMPLANT
DERMABOND ADVANCED (GAUZE/BANDAGES/DRESSINGS) ×1
DERMABOND ADVANCED .7 DNX12 (GAUZE/BANDAGES/DRESSINGS) ×1 IMPLANT
DEVICE DUBIN W/COMP PLATE 8390 (MISCELLANEOUS) ×2 IMPLANT
DRAPE LAPAROSCOPIC ABDOMINAL (DRAPES) IMPLANT
DRAPE LAPAROTOMY 100X72 PEDS (DRAPES) ×2 IMPLANT
DRAPE UTILITY XL STRL (DRAPES) ×2 IMPLANT
ELECT COATED BLADE 2.86 ST (ELECTRODE) ×2 IMPLANT
ELECT REM PT RETURN 9FT ADLT (ELECTROSURGICAL) ×2
ELECTRODE REM PT RTRN 9FT ADLT (ELECTROSURGICAL) ×1 IMPLANT
GLOVE BIOGEL PI IND STRL 8 (GLOVE) ×1 IMPLANT
GLOVE BIOGEL PI INDICATOR 8 (GLOVE) ×1
GLOVE ECLIPSE 8.0 STRL XLNG CF (GLOVE) ×2 IMPLANT
GOWN STRL REUS W/ TWL LRG LVL3 (GOWN DISPOSABLE) ×2 IMPLANT
GOWN STRL REUS W/TWL LRG LVL3 (GOWN DISPOSABLE) ×4
HEMOSTAT ARISTA ABSORB 3G PWDR (MISCELLANEOUS) IMPLANT
HEMOSTAT SNOW SURGICEL 2X4 (HEMOSTASIS) IMPLANT
KIT MARKER MARGIN INK (KITS) ×2 IMPLANT
NDL HYPO 25X1 1.5 SAFETY (NEEDLE) ×1 IMPLANT
NEEDLE HYPO 25X1 1.5 SAFETY (NEEDLE) ×2 IMPLANT
NS IRRIG 1000ML POUR BTL (IV SOLUTION) ×2 IMPLANT
PACK BASIN DAY SURGERY FS (CUSTOM PROCEDURE TRAY) ×2 IMPLANT
PENCIL BUTTON HOLSTER BLD 10FT (ELECTRODE) ×2 IMPLANT
SLEEVE SCD COMPRESS KNEE MED (MISCELLANEOUS) ×2 IMPLANT
SPONGE LAP 4X18 RFD (DISPOSABLE) ×2 IMPLANT
SUT MNCRL AB 4-0 PS2 18 (SUTURE) ×2 IMPLANT
SUT SILK 2 0 SH (SUTURE) IMPLANT
SUT VICRYL 3-0 CR8 SH (SUTURE) ×2 IMPLANT
SYR CONTROL 10ML LL (SYRINGE) ×2 IMPLANT
TOWEL GREEN STERILE FF (TOWEL DISPOSABLE) ×2 IMPLANT
TOWEL OR NON WOVEN STRL DISP B (DISPOSABLE) ×2 IMPLANT
TUBE CONNECTING 20X1/4 (TUBING) ×1 IMPLANT
YANKAUER SUCT BULB TIP NO VENT (SUCTIONS) IMPLANT

## 2018-08-07 NOTE — Anesthesia Postprocedure Evaluation (Signed)
Anesthesia Post Note  Patient: Melissa Raymond  Procedure(s) Performed: RIGHT BREAST LUMPECTOMY WITH RADIOACTIVE SEED LOCALIZATION (Right Breast)     Patient location during evaluation: PACU Anesthesia Type: General Level of consciousness: awake and alert Pain management: pain level controlled Vital Signs Assessment: post-procedure vital signs reviewed and stable Respiratory status: spontaneous breathing, nonlabored ventilation, respiratory function stable and patient connected to nasal cannula oxygen Cardiovascular status: blood pressure returned to baseline and stable Postop Assessment: no apparent nausea or vomiting Anesthetic complications: no    Last Vitals:  Vitals:   08/07/18 0900 08/07/18 0917  BP: 100/63 (!) 145/68  Pulse: 73 77  Resp: 14 16  Temp:  36.5 C  SpO2: 97% 97%    Last Pain:  Vitals:   08/07/18 0917  TempSrc:   PainSc: 6                  Genine Beckett

## 2018-08-07 NOTE — Interval H&P Note (Signed)
History and Physical Interval Note:  08/07/2018 7:18 AM  Melissa Raymond  has presented today for surgery, with the diagnosis of RIGHT BREAST MASS/COMPLEX SCELEROSING LESION  The various methods of treatment have been discussed with the patient and family. After consideration of risks, benefits and other options for treatment, the patient has consented to  Procedure(s): RIGHT BREAST LUMPECTOMY WITH RADIOACTIVE SEED LOCALIZATION (Right) as a surgical intervention .  The patient's history has been reviewed, patient examined, no change in status, stable for surgery.  I have reviewed the patient's chart and labs.  Questions were answered to the patient's satisfaction.     [place]

## 2018-08-07 NOTE — Anesthesia Procedure Notes (Signed)
Procedure Name: LMA Insertion Performed by: Amen Staszak W, CRNA Pre-anesthesia Checklist: Patient identified, Emergency Drugs available, Suction available and Patient being monitored Patient Re-evaluated:Patient Re-evaluated prior to induction Oxygen Delivery Method: Circle system utilized Preoxygenation: Pre-oxygenation with 100% oxygen Induction Type: IV induction Ventilation: Mask ventilation without difficulty LMA: LMA inserted LMA Size: 4.0 Number of attempts: 1 Placement Confirmation: positive ETCO2 Tube secured with: Tape Dental Injury: Teeth and Oropharynx as per pre-operative assessment        

## 2018-08-07 NOTE — H&P (Signed)
Melissa Raymond  Location: [facility] Patient #: [id number] DOB: Dec 26, 1945 Married / Language: English / Race: White Female  History of Present Illness  Patient words: Patient sent at the request of Dr. Radford Pax for abnormal screening mammogram. Patient with recent screening mammogram which showed an area of distortion in the right breast. Patient denies history of breast pain, nipple discharge or other abnormality with either breast. No redness, warmth, drainage, or significant pain. No history of breast cancer.                            CLINICAL DATA: 72 year old patient recalled from recent screening mammogram for possible architectural distortion in the right breast. History of stereotactic biopsy of right breast calcifications in August 2018, with benign pathology showing fibroadenomatoid change. The patient is asymptomatic. She has no history of breast surgery. EXAM: DIGITAL DIAGNOSTIC RIGHT MAMMOGRAM WITH CAD AND TOMO ULTRASOUND RIGHT BREAST COMPARISON: Previous exam(s). ACR Breast Density Category c: The breast tissue is heterogeneously dense, which may obscure small masses. FINDINGS: Additional views of the right breast today confirm an area of architectural distortion the posterior third of the upper outer right breast. Mammographic images were processed with CAD. On physical exam, no mass is palpated in the upper-outer quadrant of the right breast. Targeted ultrasound is performed, showing heterogeneously dense breast parenchyma. No definite sonographic correlate to the distortion, reproducible in orthogonal planes, is identified. Ultrasound the right axilla is negative for lymphadenopathy. IMPRESSION: Architectural distortion posterior upper outer quadrant of the right breast. The abnormality is best visualized on mammography. Right axillary lymph nodes appear within normal limits. RECOMMENDATION:  Stereotactic right breast biopsy is recommended and is being scheduled for the patient. I have discussed the findings and recommendations with the patient. Results were also provided in writing at the conclusion of the visit. If applicable, a reminder letter will be sent to the patient regarding the next appointment. BI-RADS CATEGORY 4: Suspicious. Electronically Signed By: Curlene Dolphin M.D. On: 06/16/2018 09:45  US BREAST LTD UNI RIGHT INC AXILLA (Order [id number]) - Reflex for Order [id number] Study Result  CLINICAL DATA: 72 year old patient recalled from recent screening mammogram for possible architectural distortion in the right breast. History of stereotactic biopsy of right breast calcifications in August 2018, with benign pathology showing fibroadenomatoid change. The patient is asymptomatic. She has no history of breast surgery. EXAM: DIGITAL DIAGNOSTIC RIGHT MAMMOGRAM WITH CAD AND TOMO ULTRASOUND RIGHT BREAST COMPARISON: Previous exam(s). ACR Breast Density Category c: The breast tissue is heterogeneously dense, which may obscure small masses. FINDINGS: Additional views of the right breast today confirm an area of architectural distortion the posterior third of the upper outer right breast. Mammographic images were processed with CAD. On physical exam, no mass is palpated in the upper-outer quadrant of the right breast. Targeted ultrasound is performed, showing heterogeneously dense breast parenchyma. No definite sonographic correlate to the distortion, reproducible in orthogonal planes, is identified. Ultrasound the right axilla is negative for lymphadenopathy. IMPRESSION: Architectural distortion posterior upper outer quadrant of the right breast. The abnormality is best visualized on mammography. Right axillary lymph nodes appear within normal limits. RECOMMENDATION: Stereotactic right breast biopsy is recommended and is being scheduled for the patient. I  have discussed the findings and recommendations with the patient. Results were also provided in writing at the conclusion of the visit. If applicable, a reminder letter will be sent to the patient regarding the next appointment. BI-RADS CATEGORY 4: Suspicious.  Electronically Signed By: Curlene Dolphin M.D. On: 06/16/2018 09:45               Diagnosis Breast, right, needle core biopsy, upper outer - COMPLEX SCLEROSING LESION WITH CALCIFICATIONS. - USUAL DUCTAL HYPERPLASIA. Microscopic Comment The case was called to [facility] on 06/18/2018. Vicente Males MD Pathologist, Electronic Signature (Case signed 06/18/2018) Specimen Gross and Clinical Information.  The patient is a 72 year old female.   Allergies Sabino Gasser; 06/27/2018 9:25 AM) Penicillins Hives. Allergies Reconciled  Medication History Sabino Gasser; 06/27/2018 9:25 AM) Losartan Potassium (25MG  Tablet, Oral) Active. metFORMIN HCl ER (500MG  Tablet ER 24HR, Oral) Active. OneTouch UltraSoft Lancets Active. OneTouch Verio (In Vitro) Active. Sulfamethoxazole-Trimethoprim (800-160MG  Tablet, Oral) Active. Lipitor (80MG  Tablet, Oral) Active. Medications Reconciled    Vitals  06/27/2018 9:25 AM Weight: 147.5 lb Height: 64.5in Body Surface Area: 1.73 m Body Mass Index: 24.93 kg/m  Temp.: 98.80F(Oral)  Pulse: 75 (Regular)  BP: 128/82 (Sitting, Left Arm, Standard)      Physical Exam   General Mental Status-Alert. General Appearance-Consistent with stated age. Hydration-Well hydrated. Voice-Normal.  Head and Neck Head-normocephalic, atraumatic with no lesions or palpable masses. Trachea-midline. Thyroid Gland Characteristics - normal size and consistency.  Chest and Lung Exam Chest and lung exam reveals -quiet, even and easy respiratory effort with no use of accessory muscles and on auscultation, normal breath  sounds, no adventitious sounds and normal vocal resonance. Inspection Chest Wall - Normal. Back - normal.  Breast Breast - Left-Symmetric, Non Tender, No Biopsy scars, no Dimpling, No Inflammation, No Lumpectomy scars, No Mastectomy scars, No Peau d' Orange. Breast - Right-Symmetric, Non Tender, No Biopsy scars, no Dimpling, No Inflammation, No Lumpectomy scars, No Mastectomy scars, No Peau d' Orange. Breast Lump-No Palpable Breast Mass.  Cardiovascular Cardiovascular examination reveals -normal heart sounds, regular rate and rhythm with no murmurs and normal pedal pulses bilaterally.  Neurologic Neurologic evaluation reveals -alert and oriented x 3 with no impairment of recent or remote memory. Mental Status-Normal.  Musculoskeletal Normal Exam - Left-Upper Extremity Strength Normal and Lower Extremity Strength Normal. Normal Exam - Right-Upper Extremity Strength Normal and Lower Extremity Strength Normal.  Lymphatic Head & Neck  General Head & Neck Lymphatics: Bilateral - Description - Normal. Axillary  General Axillary Region: Bilateral - Description - Normal. Tenderness - Non Tender.    Assessment & Plan   MASS OF RIGHT BREAST ON MAMMOGRAM (N63.10) Impression: CSL  Discussed the potential risk of malignancy being up to 1 and 5 patient's. Discussed observation as well as right breast lumpectomy with C localization the pros and cons of this.  Patient is opted for right breast lumpectomy. Discussed right breast lumpectomy received localization versus observation with six-month follow-up. The pros and cons were discussed. Potential complications of both discuss long-term expectations. She is opted for right breast localized lumpectomy. Risk of lumpectomy include bleeding, infection, seroma, more surgery, use of seed/wire, wound care, cosmetic deformity and the need for other treatments, death , blood clots, death. Pt agrees to proceed.  Current  Plans Pt Education - [facility] Free Text Education/Instructions: discussed with patient and provided information. Pt Education - [facility] Breast Biopsy HCI: discussed with patient and provided information. The anatomy and the physiology was discussed. The pathophysiology and natural history of the disease was discussed. Options were discussed and recommendations were made. Technique, risks, benefits, & alternatives were discussed. Risks such as stroke, heart attack, bleeding, seroma infection, death, cosmetic deformity and other risks discussed. Questions  answered. The patient agrees to proceed

## 2018-08-07 NOTE — Op Note (Signed)
Preoperative diagnosis: Right breast complex sclerosing lesion  Postoperative diagnosis: Same  Procedure: Right breast seed localized lumpectomy  Surgeon: Erroll Luna, MD  Anesthesia: LMA with local  EBL: 20 cc  IV fluids: Per anesthesia record  Specimen: Right breast tissue with both seed and localizing clip verified by Faxitron  Drains: None  Indications for procedure: The patient is a 72 year old female who underwent recent screening mammography.  An area of architectural distortion was noted right breast upper central quadrant core biopsy proven to be complex sclerosing lesion and calcifications.  We discussed the pros and cons of excisional treatment of this problem.  We discussed potential upgrade risk to malignancy in 1 and 10 cases.  The procedure has been discussed with the patient. Alternatives to surgery have been discussed with the patient.  Risks of surgery include bleeding,  Infection,  Seroma formation, death,  and the need for further surgery.   The patient understands and wishes to proceed.   Description of procedure: The patient was met in the holding area and questions were answered.  The right breast was evaluated by the neoprobe and the seed was active.  She was then taken back to the operating room.  She was placed supine upon the operating table.  After induction of LMA anesthesia, right breast was prepped and draped in sterile fashion.  Timeout was done.  Local anesthetic was then treated in the upper central right breast where the neoprobe to localize the seed.  Curvilinear incision was made over the signal.  Dissection was carried down and all tissue around the seed and clip were excised with grossly negative margins.  Hemostasis achieved with cautery.  Irrigation used.  Local anesthetic infiltrated into the cavity.  The wound was closed with 3-0 Vicryl and 4-0 Monocryl.  Dermabond applied.  All final counts were found to be correct.  Breast binder placed.  The  patient was awoke extubated taken to recovery in satisfactory condition.

## 2018-08-07 NOTE — Discharge Instructions (Signed)
[facility] °Office Phone Number [phone] ° °BREAST BIOPSY/ PARTIAL MASTECTOMY: POST OP INSTRUCTIONS ° °Always review your discharge instruction sheet given to you by the facility where your surgery was performed. ° °IF YOU HAVE DISABILITY OR FAMILY LEAVE FORMS, YOU MUST BRING THEM TO THE OFFICE FOR PROCESSING.  DO NOT GIVE THEM TO YOUR DOCTOR. ° °1. A prescription for pain medication may be given to you upon discharge.  Take your pain medication as prescribed, if needed.  If narcotic pain medicine is not needed, then you may take acetaminophen (Tylenol) or ibuprofen (Advil) as needed. °2. Take your usually prescribed medications unless otherwise directed °3. If you need a refill on your pain medication, please contact your pharmacy.  They will contact our office to request authorization.  Prescriptions will not be filled after 5pm or on week-ends. °4. You should eat very light the first 24 hours after surgery, such as soup, crackers, pudding, etc.  Resume your normal diet the day after surgery. °5. Most patients will experience some swelling and bruising in the breast.  Ice packs and a good support bra will help.  Swelling and bruising can take several days to resolve.  °6. It is common to experience some constipation if taking pain medication after surgery.  Increasing fluid intake and taking a stool softener will usually help or prevent this problem from occurring.  A mild laxative (Milk of Magnesia or Miralax) should be taken according to package directions if there are no bowel movements after 48 hours. °7. Unless discharge instructions indicate otherwise, you may remove your bandages 24-48 hours after surgery, and you may shower at that time.  You may have steri-strips (small skin tapes) in place directly over the incision.  These strips should be left on the skin for 7-10 days.  If your surgeon used skin glue on the incision, you may shower in 24 hours.  The glue will flake off over the  next 2-3 weeks.  Any sutures or staples will be removed at the office during your follow-up visit. °8. ACTIVITIES:  You may resume regular daily activities (gradually increasing) beginning the next day.  Wearing a good support bra or sports bra minimizes pain and swelling.  You may have sexual intercourse when it is comfortable. °a. You may drive when you no longer are taking prescription pain medication, you can comfortably wear a seatbelt, and you can safely maneuver your car and apply brakes. °b. RETURN TO WORK:  ______________________________________________________________________________________ °9. You should see your doctor in the office for a follow-up appointment approximately two weeks after your surgery.  Your doctor’s nurse will typically make your follow-up appointment when she calls you with your pathology report.  Expect your pathology report 2-3 business days after your surgery.  You may call to check if you do not hear from us after three days. °10. OTHER INSTRUCTIONS: _______________________________________________________________________________________________ _____________________________________________________________________________________________________________________________________ °_____________________________________________________________________________________________________________________________________ °_____________________________________________________________________________________________________________________________________ ° °WHEN TO CALL YOUR DOCTOR: °1. Fever over 101.0 °2. Nausea and/or vomiting. °3. Extreme swelling or bruising. °4. Continued bleeding from incision. °5. Increased pain, redness, or drainage from the incision. ° °The clinic staff is available to answer your questions during regular business hours.  Please don’t hesitate to call and ask to speak to one of the nurses for clinical concerns.  If you have a medical emergency, go to the nearest  emergency room or call 911.  A surgeon from [facility] is always on call at the hospital. ° °For further questions, please visit [email or web address]  ° ° ° °  Post Anesthesia Home Care Instructions  NO TYLENOL UNTIL AFTER 4:00PM ON 08/07/18.  Activity: Get plenty of rest for the remainder of the day. A responsible individual must stay with you for 24 hours following the procedure.  For the next 24 hours, DO NOT: -Drive a car -Paediatric nurse -Drink alcoholic beverages -Take any medication unless instructed by your physician -Make any legal decisions or sign important papers.  Meals: Start with liquid foods such as gelatin or soup. Progress to regular foods as tolerated. Avoid greasy, spicy, heavy foods. If nausea and/or vomiting occur, drink only clear liquids until the nausea and/or vomiting subsides. Call your physician if vomiting continues.  Special Instructions/Symptoms: Your throat may feel dry or sore from the anesthesia or the breathing tube placed in your throat during surgery. If this causes discomfort, gargle with warm salt water. The discomfort should disappear within 24 hours.  If you had a scopolamine patch placed behind your ear for the management of post- operative nausea and/or vomiting:  1. The medication in the patch is effective for 72 hours, after which it should be removed.  Wrap patch in a tissue and discard in the trash. Wash hands thoroughly with soap and water. 2. You may remove the patch earlier than 72 hours if you experience unpleasant side effects which may include dry mouth, dizziness or visual disturbances. 3. Avoid touching the patch. Wash your hands with soap and water after contact with the patch.

## 2018-08-07 NOTE — Transfer of Care (Signed)
Immediate Anesthesia Transfer of Care Note  Patient: Melissa Raymond  Procedure(s) Performed: RIGHT BREAST LUMPECTOMY WITH RADIOACTIVE SEED LOCALIZATION (Right Breast)  Patient Location: PACU  Anesthesia Type:General  Level of Consciousness: awake and sedated  Airway & Oxygen Therapy: Patient Spontanous Breathing and Patient connected to face mask oxygen  Post-op Assessment: Report given to RN and Post -op Vital signs reviewed and stable  Post vital signs: Reviewed and stable  Last Vitals:  Vitals Value Taken Time  BP 117/61 08/07/2018  8:25 AM  Temp    Pulse 79 08/07/2018  8:26 AM  Resp    SpO2 98 % 08/07/2018  8:26 AM  Vitals shown include unvalidated device data.  Last Pain:  Vitals:   08/07/18 0634  TempSrc: Oral  PainSc: 0-No pain      Patients Stated Pain Goal: 0 (88/91/69 4503)  Complications: No apparent anesthesia complications

## 2018-08-11 ENCOUNTER — Encounter (HOSPITAL_BASED_OUTPATIENT_CLINIC_OR_DEPARTMENT_OTHER): Payer: Self-pay | Admitting: Surgery

## 2018-08-15 ENCOUNTER — Ambulatory Visit: Payer: Medicare Other | Admitting: Cardiovascular Disease

## 2018-08-21 ENCOUNTER — Ambulatory Visit (INDEPENDENT_AMBULATORY_CARE_PROVIDER_SITE_OTHER): Payer: Medicare Other | Admitting: Cardiovascular Disease

## 2018-08-21 ENCOUNTER — Encounter: Payer: Self-pay | Admitting: Cardiovascular Disease

## 2018-08-21 VITALS — BP 148/72 | HR 70 | Ht 64.0 in | Wt 145.0 lb

## 2018-08-21 DIAGNOSIS — E782 Mixed hyperlipidemia: Secondary | ICD-10-CM

## 2018-08-21 DIAGNOSIS — I10 Essential (primary) hypertension: Secondary | ICD-10-CM | POA: Diagnosis not present

## 2018-08-21 NOTE — Patient Instructions (Signed)
Medication Instructions:  Your physician recommends that you continue on your current medications as directed. Please refer to the Current Medication list given to you today.  If you need a refill on your cardiac medications before your next appointment, please call your pharmacy.   Lab work: TODAY - cholesterol, liver panel, basic metabolic panel  If you have labs (blood work) drawn today and your tests are completely normal, you will receive your results only by: Marland Kitchen MyChart Message (if you have MyChart) OR . A paper copy in the mail If you have any lab test that is abnormal or we need to change your treatment, we will call you to review the results.   Testing/Procedures: None Ordered    Follow-Up: At [facility], you and your health needs are our priority.  As part of our continuing mission to provide you with exceptional heart care, we have created designated Provider Care Teams.  These Care Teams include your primary Cardiologist (physician) and Advanced Practice Providers (APPs -  Physician Assistants and Nurse Practitioners) who all work together to provide you with the care you need, when you need it. You will need a follow up appointment in:  1 year.  Please call our office 2 months in advance to schedule this appointment.  You may see Mertie Moores, MD or one of the following Advanced Practice Providers on your designated Care Team: Richardson Dopp, PA-C [place], [place] . Daune Perch, NP

## 2018-08-21 NOTE — Progress Notes (Signed)
Melissa Raymond Date of Birth  Dec 10, 1945       [facility] Office [address], [place], [place]  [address]   [place]  [address] [place]   Fax  [phone]     Fax [phone]   Previous patient of Dr. Verl Blalock  Problem List: 1. Hyperlipidemia 2.  Diabetes Mellitus 3. Borderline HTN     Melissa Raymond is a 72 yo with hx of hyperlipidemia.   Strong family hx of CVA.   She is healthy.   She does elliptical and walks regularly.  Does yoga regularly.   Has lost 8 lbs in the past 2-3 weeks because her cholesterol level was up.    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 30, 2015:  Melissa Raymond is a 72 yo who we see for hx of hyperlipidemia. Exercising regularly  Did have a back injury but i getting back into working out   Jan. 24, 2017:  Melissa Raymond is seen today for follow up of her hyperlipidemia.  Getting some exercise.  Tolerating the Atorvastatin well.  Under lots of stress.   Jan. 24, 2018:  Under lots of stress.    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   ( [employer] )  [facility]  Sept. 10, 2018:  Melissa Raymond is seen today .   Under lots of stress recently  No CP or dyspnea.    BP has been mostly OK Does not eat salt .    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 aspirin EC 81 MG tablet Take 1 tablet (81 mg total) by mouth daily. 90 tablet 3  . atorvastatin (LIPITOR) 80 MG tablet TAKE 1 TABLET(80 MG) BY MOUTH DAILY 90 tablet 2  . hydrocortisone cream 1 % Apply 1  application topically 2 (two) times daily as needed (skin rash).     Marland Kitchen losartan (COZAAR) 25 MG tablet TAKE 1 TABLET(25 MG) BY MOUTH DAILY 90 tablet 1  . metFORMIN (GLUCOPHAGE-XR) 500 MG 24 hr tablet Take 500 mg by mouth every evening. With evening meal  5  . Multiple Vitamin (MULTIVITAMIN WITH MINERALS) TABS tablet Take 1 tablet by mouth daily.     No current facility-administered medications on file prior to visit.     Allergies  Allergen Reactions  . Penicillins Swelling and Rash    Has patient had a PCN reaction causing immediate rash, facial/tongue/throat swelling, SOB or lightheadedness with hypotension: Yes Has patient had a PCN reaction causing severe rash involving mucus membranes or skin necrosis: No Has patient had a PCN reaction that required hospitalization No Has patient had a PCN reaction occurring within the last 10 years: No If all of the above answers are "NO", then may proceed with Cephalosporin use.     Past Medical History:  Diagnosis Date  . Arthritis    hands  . Breast mass, right   .  DM (diabetes mellitus) ([place])   . HTN (hypertension)   . Hyperlipidemia     Past Surgical History:  Procedure Laterality Date  . ABDOMINAL HYSTERECTOMY    . BREAST BIOPSY Right    negative  . BREAST LUMPECTOMY WITH RADIOACTIVE SEED LOCALIZATION Right 08/07/2018   Procedure: RIGHT BREAST LUMPECTOMY WITH RADIOACTIVE SEED LOCALIZATION;  Surgeon: Erroll Luna, MD;  Location: [place];  Service: General;  Laterality: Right;  . FLEXIBLE SIGMOIDOSCOPY N/A 12/30/2016   Procedure: FLEXIBLE SIGMOIDOSCOPY;  Surgeon: Teena Irani, MD;  Location: [facility] END[facility]COPY;  Service: Endoscopy;  Laterality: N/A;    Social History   Tobacco Use  Smoking Status Never Smoker  Smokeless Tobacco Never Used    Social History   Substance and Sexual Activity  Alcohol Use Yes   Comment: social    Family History  Problem Relation Age of Onset  . Breast cancer Maternal Aunt      Reviw of Systems:  Reviewed in the HPI.  All other systems are negative.  Physical Exam: Blood pressure (!) 148/72, pulse 70, height 5\' 4"  (1.626 m), weight 145 lb (65.8 kg), SpO2 96 %. Wt Readings from Last 3 Encounters:  08/21/18 145 lb (65.8 kg)  08/07/18 148 lb 13 oz (67.5 kg)  01/01/18 147 lb 3.2 oz (66.8 kg)    Physical Exam: Blood pressure (!) 148/72, pulse 70, height 5\' 4"  (1.626 m), weight 145 lb (65.8 kg), SpO2 96 %.  GEN:  Well nourished, well developed in no acute distress HEENT: Normal NECK: No JVD; No carotid bruits LYMPHATICS: No lymphadenopathy CARDIAC: RRR   RESPIRATORY:  Clear to auscultation without rales, wheezing or rhonchi  ABDOMEN: Soft, non-tender, non-distended MUSCULOSKELETAL:  No edema; No deformity  SKIN: Warm and dry NEUROLOGIC:  Alert and oriented x 3   ECG:   Assessment / Plan:   1. Hyperlipidemia-   Cont atorva. Is due to lipid levels today  Will get lipid, liver enz, bmp    2. Hypertension:     BP is a little elevated today Continue to work on diet , exercise . She wants to lose some weight .   Will see in 1 year     Mertie Moores, MD  08/21/2018 12:03 PM    [address],  [place], [place]  [address] Pager [phone] Phone: [phone]; Fax: [phone]

## 2018-08-22 ENCOUNTER — Telehealth: Payer: Self-pay | Admitting: Nurse Practitioner

## 2018-08-22 DIAGNOSIS — E782 Mixed hyperlipidemia: Secondary | ICD-10-CM

## 2018-08-22 LAB — BASIC METABOLIC PANEL
BUN/Creatinine Ratio: 21 (ref 12–28)
BUN: 11 mg/dL (ref 8–27)
CO2: 24 mmol/L (ref 20–29)
Calcium: 9.7 mg/dL (ref 8.7–10.3)
Chloride: 97 mmol/L (ref 96–106)
Creatinine, Ser: 0.52 mg/dL — ABNORMAL LOW (ref 0.57–1.00)
GFR calc Af Amer: 111 mL/min/{1.73_m2} (ref >59)
GFR calc non Af Amer: 96 mL/min/{1.73_m2} (ref >59)
Glucose: 115 mg/dL — ABNORMAL HIGH (ref 65–99)
Potassium: 4 mmol/L (ref 3.5–5.2)
Sodium: 140 mmol/L (ref 134–144)

## 2018-08-22 LAB — HEPATIC FUNCTION PANEL
ALT: 29 IU/L (ref 0–32)
AST: 24 IU/L (ref 0–40)
Albumin: 5 g/dL — ABNORMAL HIGH (ref 3.5–4.8)
Alkaline Phosphatase: 67 IU/L (ref 39–117)
Bilirubin Total: 0.4 mg/dL (ref 0.0–1.2)
Bilirubin, Direct: 0.12 mg/dL (ref 0.00–0.40)
Total Protein: 6.8 g/dL (ref 6.0–8.5)

## 2018-08-22 LAB — LIPID PANEL
Chol/HDL Ratio: 2.9 ratio (ref 0.0–4.4)
Cholesterol, Total: 230 mg/dL — ABNORMAL HIGH (ref 100–199)
HDL: 80 mg/dL (ref >39)
LDL Calculated: 123 mg/dL — ABNORMAL HIGH (ref 0–99)
Triglycerides: 133 mg/dL (ref 0–149)
VLDL Cholesterol Cal: 27 mg/dL (ref 5–40)

## 2018-08-22 MED ORDER — ROSUVASTATIN CALCIUM 10 MG PO TABS: 10.0000 mg | ORAL_TABLET | Freq: Every day | ORAL | 3 refills | Status: DC

## 2018-08-22 NOTE — Telephone Encounter (Signed)
-----   Message from Thayer Headings, MD sent at 08/22/2018  8:53 AM EDT ----- Chol levels are higher -  If she is still taking the atorvastatin, it does not seem to be working as well and we should   try rosuvastatin 10 a day , check labs in 3 months  If she has stopped the atorva, please have her restart it and check lipids, bmp, and liver enz in 3 months.

## 2018-08-22 NOTE — Telephone Encounter (Signed)
Results and plan of care reviewed with patient who verbalized understanding and agreement. She states she has been taking the atorvastatin regularly. She is scheduled for repeat lab appointment on 2/4. I advised her to call back with questions or concerns prior to appointment. She thanked me for the call.

## 2018-10-16 DIAGNOSIS — I10 Essential (primary) hypertension: Secondary | ICD-10-CM | POA: Diagnosis not present

## 2018-10-16 DIAGNOSIS — E7800 Pure hypercholesterolemia, unspecified: Secondary | ICD-10-CM | POA: Diagnosis not present

## 2018-10-16 DIAGNOSIS — E119 Type 2 diabetes mellitus without complications: Secondary | ICD-10-CM | POA: Diagnosis not present

## 2018-11-05 ENCOUNTER — Telehealth: Payer: Self-pay | Admitting: Cardiovascular Disease

## 2018-11-05 NOTE — Telephone Encounter (Signed)
New Message    Pt c/o medication issue:  1. Name of Medication: Rosuvastatin  2. How are you currently taking this medication (dosage and times per day)? n/a  3. Are you having a reaction (difficulty breathing--STAT)? No   4. What is your medication issue? Patient's pharmacy filled to prescriptions for patients cholesterol and wants to know what's the recent one the doctor prescribed.

## 2018-11-05 NOTE — Telephone Encounter (Signed)
Spoke with patient who states she received both Atorvastatin and Rosuvastatin Rx from pharmacy and wants to verify correct medication. I advised that we discontinued the Atorvastatin and she should take Rosuvastatin 10 mg daily. She states she is going to contact pharmacy and I advised that if they cannot take the medication back to hold on to it in the event that she goes back on that medication in the future. She verbalized understanding and agreement and thanked me for the call.

## 2018-11-24 ENCOUNTER — Other Ambulatory Visit: Payer: Medicare Other | Admitting: *Deleted

## 2018-11-24 DIAGNOSIS — E782 Mixed hyperlipidemia: Secondary | ICD-10-CM | POA: Diagnosis not present

## 2018-11-24 LAB — HEPATIC FUNCTION PANEL
ALT: 30 IU/L (ref 0–32)
AST: 18 IU/L (ref 0–40)
Albumin: 4.5 g/dL (ref 3.7–4.7)
Alkaline Phosphatase: 63 IU/L (ref 39–117)
Bilirubin Total: 0.5 mg/dL (ref 0.0–1.2)
Bilirubin, Direct: 0.16 mg/dL (ref 0.00–0.40)
Total Protein: 6.6 g/dL (ref 6.0–8.5)

## 2018-11-24 LAB — LIPID PANEL
Chol/HDL Ratio: 2.2 ratio (ref 0.0–4.4)
Cholesterol, Total: 199 mg/dL (ref 100–199)
HDL: 92 mg/dL (ref >39)
LDL Calculated: 83 mg/dL (ref 0–99)
Triglycerides: 119 mg/dL (ref 0–149)
VLDL Cholesterol Cal: 24 mg/dL (ref 5–40)

## 2018-11-24 LAB — BASIC METABOLIC PANEL
BUN/Creatinine Ratio: 17 (ref 12–28)
BUN: 11 mg/dL (ref 8–27)
CO2: 24 mmol/L (ref 20–29)
Calcium: 9.8 mg/dL (ref 8.7–10.3)
Chloride: 97 mmol/L (ref 96–106)
Creatinine, Ser: 0.65 mg/dL (ref 0.57–1.00)
GFR calc Af Amer: 103 mL/min/{1.73_m2} (ref >59)
GFR calc non Af Amer: 90 mL/min/{1.73_m2} (ref >59)
Glucose: 142 mg/dL — ABNORMAL HIGH (ref 65–99)
Potassium: 4.3 mmol/L (ref 3.5–5.2)
Sodium: 137 mmol/L (ref 134–144)

## 2018-11-25 ENCOUNTER — Other Ambulatory Visit: Payer: Medicare Other

## 2019-01-19 ENCOUNTER — Telehealth: Payer: Self-pay | Admitting: Cardiovascular Disease

## 2019-01-19 NOTE — Telephone Encounter (Signed)
New message   Patient states that she is having nausea and tingling in hands. Patient states that she is numbness in left arm but she is having no other symptoms. Patient states that the numbness in her hand started on 01/16/2019. Patient states that she is having some problems with memory. Please advise.

## 2019-01-19 NOTE — Telephone Encounter (Signed)
Pt called to report that she has been having some tingling in her left arm the last few days.. she says it goes numb and tingles but no pain... no chest pain, headache, dizziness, sob... does not worsen with activity and not related to movement. She says she has had some problems with memory lately. She does not know what her BP is running.   She is anxious about it since she has a family h/o CVA with her Mom and her 43.  Pt also has had problems with her rotator cuff on the left side.. no back pain but still has pain in her shoulder with lifting.  I advised her that she should contact her PMD since it could also be nerve or musculoskeletal related and she agrees..  Pt still wants to let Dr. Acie Fredrickson know that this has been happening and with her family history would he like to set up a sooner appt than next Fall...she is aware that we are working mostly by remote and she as well does not feel comfortable coming into the office around this time.   Pt will call her PMD.

## 2019-01-19 NOTE — Telephone Encounter (Signed)
Pt agreed with Dr. Acie Fredrickson and feels it is a nerve in her neck and will call her PMD.. she will call back if she develops any new symptoms. Pt was thankful for the call.

## 2019-01-19 NOTE — Telephone Encounter (Signed)
I agree that this sounds more like a cervical spine issue .   Does not sound cardiac

## 2019-01-27 ENCOUNTER — Other Ambulatory Visit: Payer: Self-pay | Admitting: Cardiovascular Disease

## 2019-04-17 DIAGNOSIS — Z79899 Other long term (current) drug therapy: Secondary | ICD-10-CM | POA: Diagnosis not present

## 2019-04-17 DIAGNOSIS — Z7984 Long term (current) use of oral hypoglycemic drugs: Secondary | ICD-10-CM | POA: Diagnosis not present

## 2019-04-17 DIAGNOSIS — E7800 Pure hypercholesterolemia, unspecified: Secondary | ICD-10-CM | POA: Diagnosis not present

## 2019-04-17 DIAGNOSIS — E119 Type 2 diabetes mellitus without complications: Secondary | ICD-10-CM | POA: Diagnosis not present

## 2019-04-17 DIAGNOSIS — I10 Essential (primary) hypertension: Secondary | ICD-10-CM | POA: Diagnosis not present

## 2019-05-26 IMAGING — MG STEREOTACTIC VACUUM ASSIST RIGHT
8 of 14 series · 8 of 26 positions shown · non-contrast
Comparison: Previous exams.

ADDENDUM:
Pathology revealed COMPLEX SCLEROSING LESION WITH CALCIFICATIONS,
USUAL DUCTAL HYPERPLASIA of RIGHT breast, upper outer. This was
found to be concordant by Dr. Baljit Terrero, with excision recommended.

Pathology results were discussed with the patient by telephone. The
patient reported doing well after the biopsy with tenderness at the
site. Post biopsy instructions and care were reviewed and questions
were answered. The patient was encouraged to call The [REDACTED]
Surgical consultation has been arranged with Dr. Hung-Chi Rodarte at
[REDACTED] on June 27, 2018.
Pathology results reported by Roberto Tiger, RN on 06/18/2018.
CLINICAL DATA: Patient with possible right breast distortion.
EXAM:
RIGHT BREAST STEREOTACTIC CORE NEEDLE BIOPSY

[R (1 of 8)]
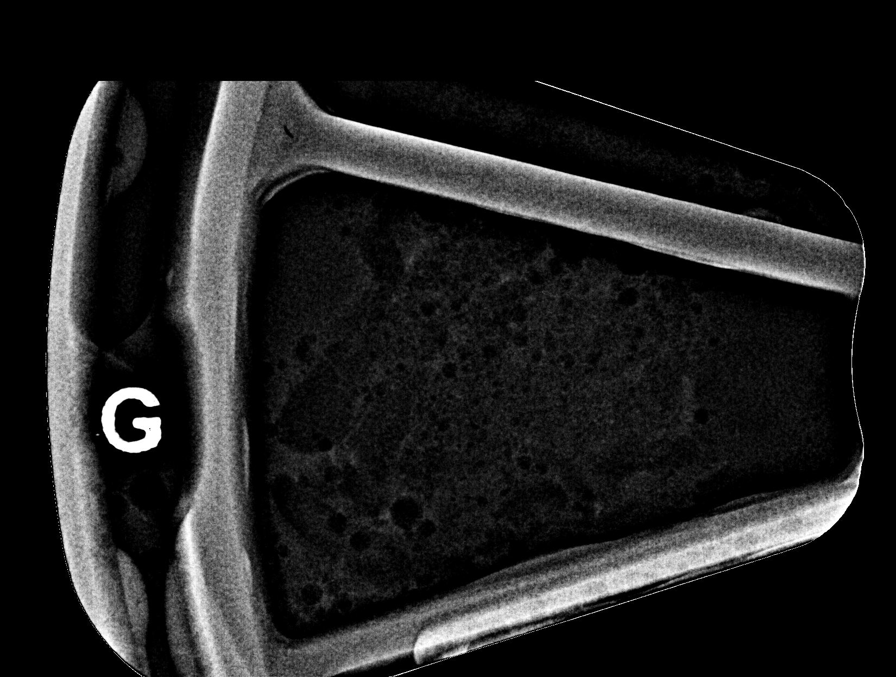

[R (2 of 8)]
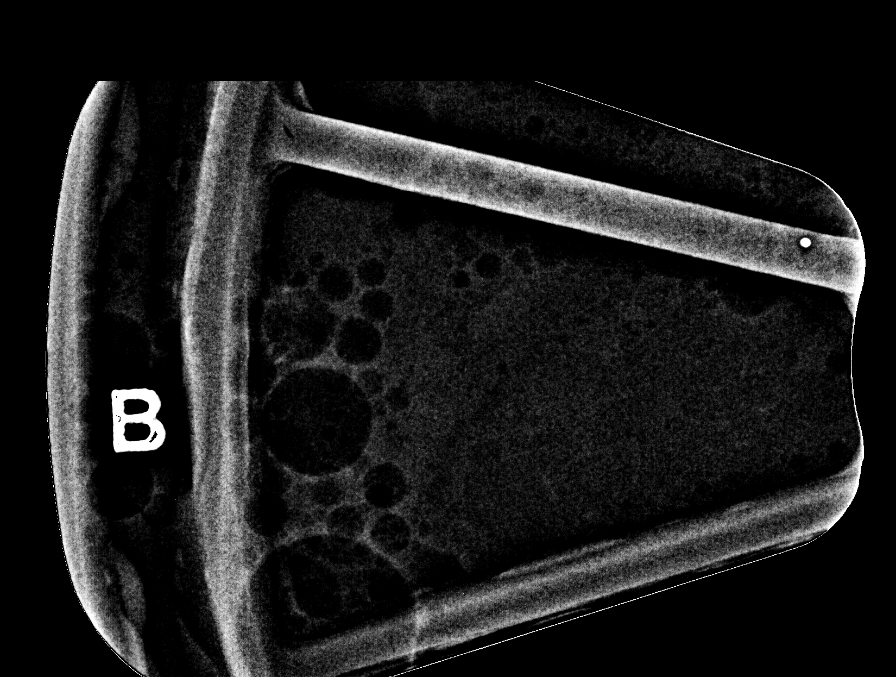

[R (3 of 8)]
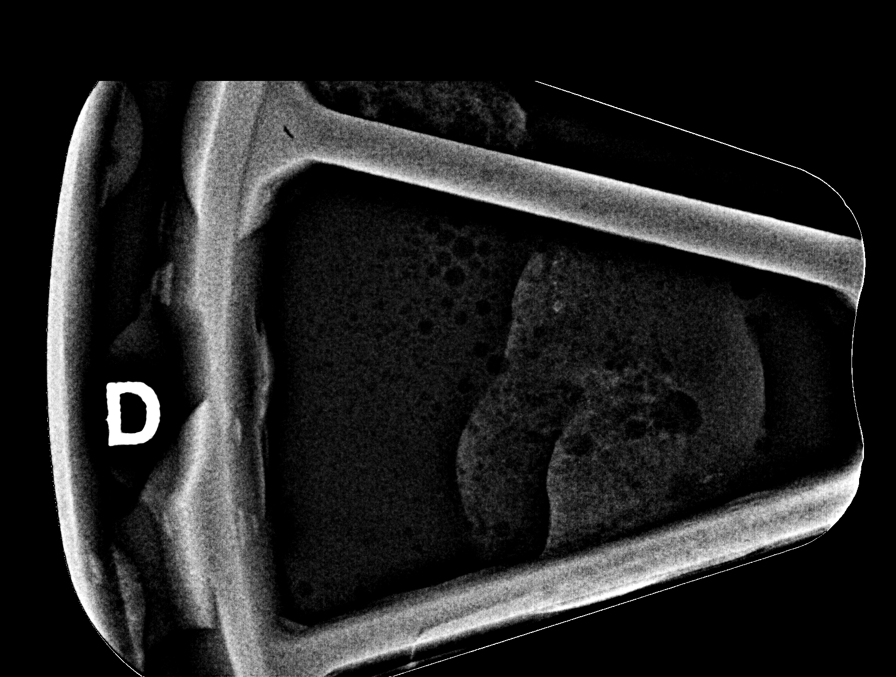

[R (4 of 8)]
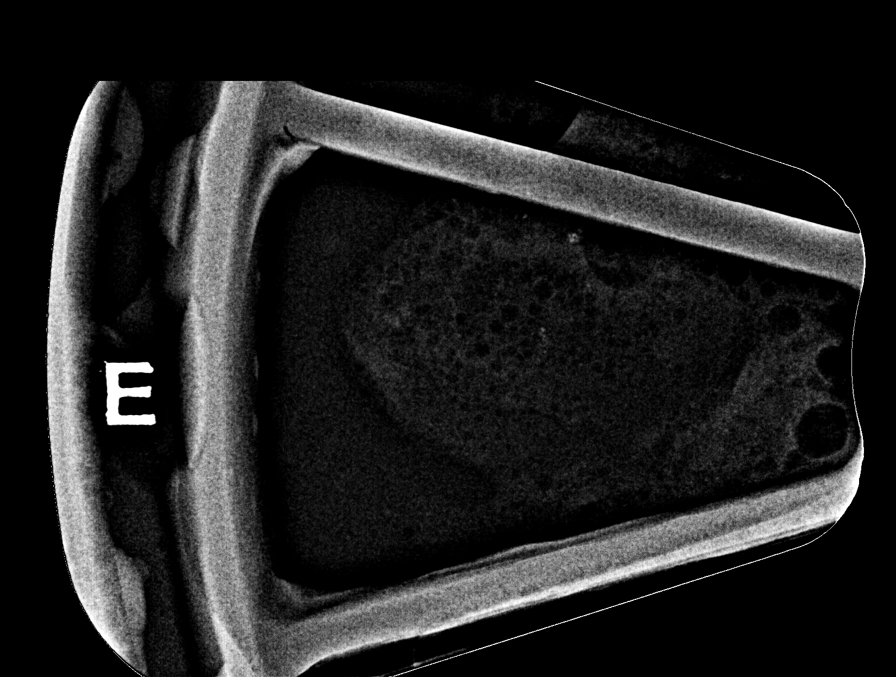

[R (5 of 8)]
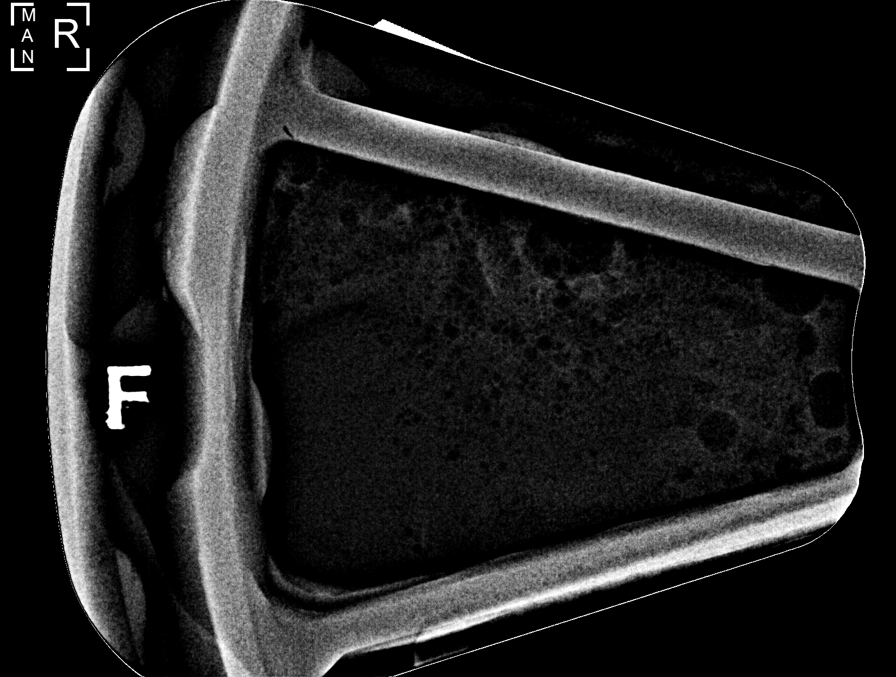

[R (6 of 8)]
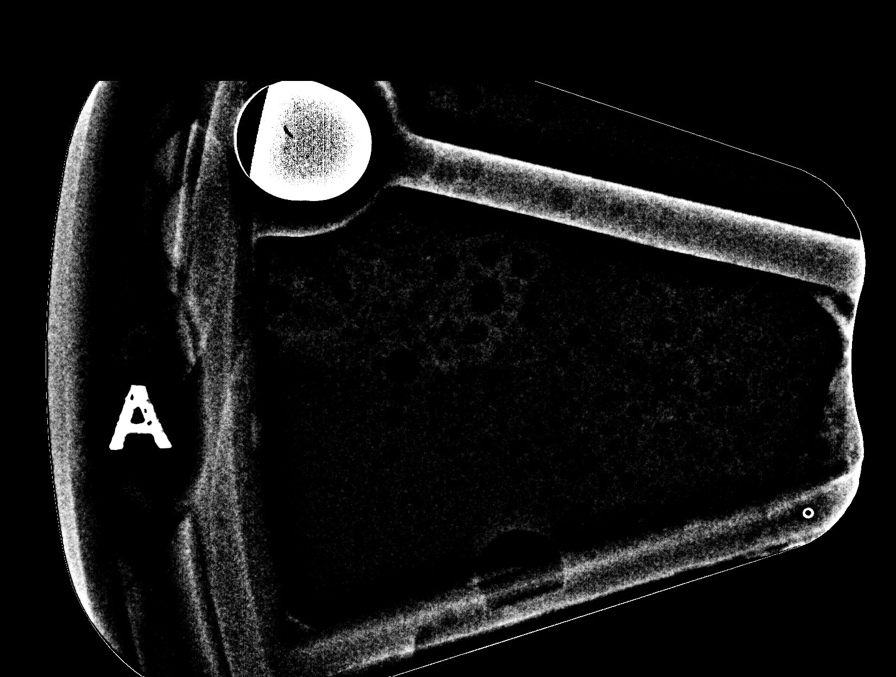

[R (7 of 8)]
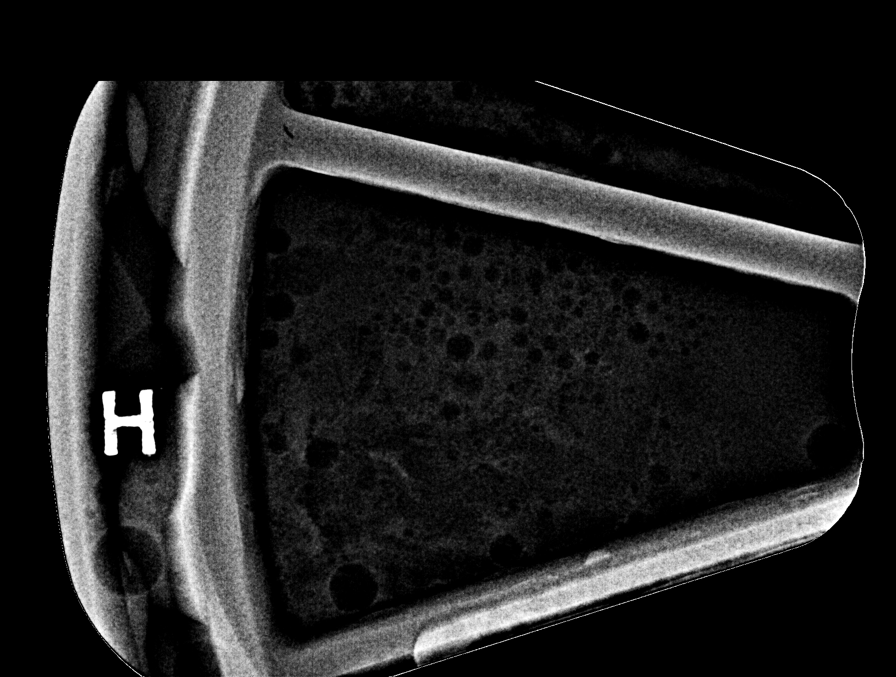

[R (8 of 8)]
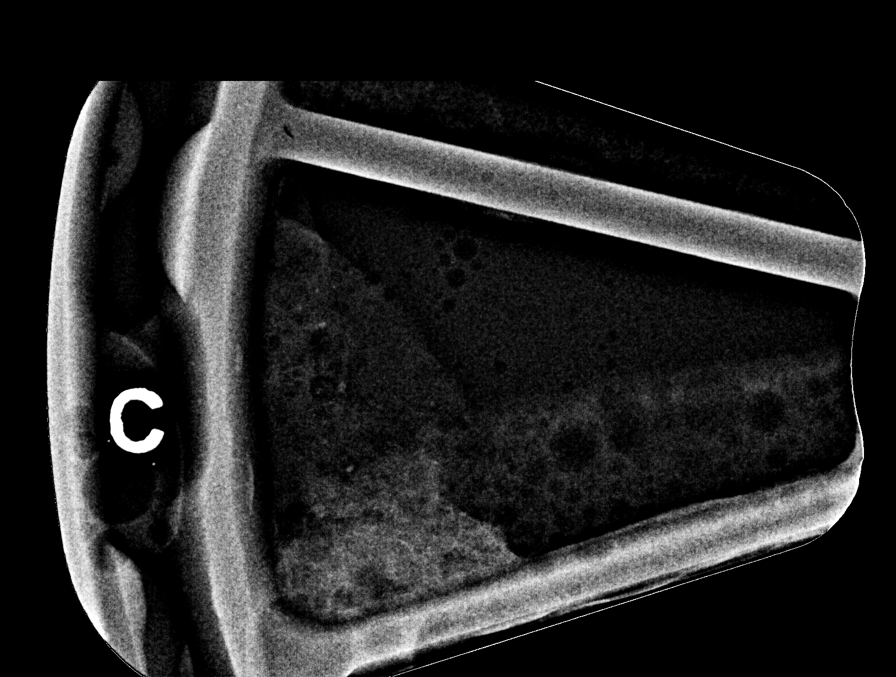

[8 of 26 positions shown; findings below may reference images not displayed]



Using sterile technique and 1% Lidocaine as local anesthetic, under
stereotactic guidance, a 9 gauge vacuum assisted device was used to
perform core needle biopsy of possible distortion within the
upper-outer right breast using a cranial approach.

Lesion quadrant: Upper outer quadrant

At the conclusion of the procedure, a X shaped tissue marker clip
was deployed into the biopsy cavity. Follow-up 2-view mammogram was
performed and dictated separately.
IMPRESSION: Stereotactic-guided biopsy of possible right breast distortion. No
apparent complications.

## 2019-06-01 ENCOUNTER — Other Ambulatory Visit: Payer: Self-pay

## 2019-06-01 ENCOUNTER — Ambulatory Visit
Admission: RE | Admit: 2019-06-01 | Discharge: 2019-06-01 | Disposition: A | Discharge status: Home / Self Care | Payer: Medicare Other | Source: Ambulatory Visit | Attending: Family Medicine | Admitting: Family Medicine

## 2019-06-01 ENCOUNTER — Other Ambulatory Visit: Payer: Self-pay | Admitting: Family Medicine

## 2019-06-01 DIAGNOSIS — S99922A Unspecified injury of left foot, initial encounter: Secondary | ICD-10-CM

## 2019-06-01 DIAGNOSIS — M79672 Pain in left foot: Secondary | ICD-10-CM | POA: Diagnosis not present

## 2019-06-01 DIAGNOSIS — S9032XA Contusion of left foot, initial encounter: Secondary | ICD-10-CM | POA: Diagnosis not present

## 2019-06-10 DIAGNOSIS — L601 Onycholysis: Secondary | ICD-10-CM | POA: Diagnosis not present

## 2019-06-15 DIAGNOSIS — M79672 Pain in left foot: Secondary | ICD-10-CM | POA: Diagnosis not present

## 2019-06-18 ENCOUNTER — Other Ambulatory Visit: Payer: Self-pay

## 2019-06-18 ENCOUNTER — Ambulatory Visit (INDEPENDENT_AMBULATORY_CARE_PROVIDER_SITE_OTHER): Payer: Medicare Other | Admitting: Sports Medicine

## 2019-06-18 ENCOUNTER — Ambulatory Visit: Payer: Self-pay

## 2019-06-18 VITALS — BP 132/76 | Ht 64.5 in | Wt 145.0 lb

## 2019-06-18 DIAGNOSIS — M25572 Pain in left ankle and joints of left foot: Secondary | ICD-10-CM

## 2019-06-18 NOTE — Progress Notes (Signed)
Melissa Raymond - 73 y.o. female MRN [id number]  Date of birth: 09-17-1946  SUBJECTIVE:   CC: left foot pain   73 yo female presenting with left foot pain for the past month. She reports that over the past several months, she has been walking 4-5 miles a day. 1 month ago, she walked 7 miles. The following day, she had pain on dorsum of left foot so she decided to walk for 3 miles instead of usual 4-5. The following day, she noted swelling, bruising on dorsum of left foot and pain with walking. She has not walked for exercise since this time but has been on her feet doing housework. She iced it frequently the first week and then intermittently since then. It has improved slightly over the past month but is still painful.   Left foot X-rays obtained on 8/10 with no evidence of fracture.   ROS: No unexpected weight loss, fever, chills, swelling, instability, muscle pain, numbness/tingling, redness, otherwise see HPI   PMHx - Updated and reviewed.  Contributory factors include: Negative PSHx - Updated and reviewed.  Contributory factors include:  Negative FHx - Updated and reviewed.  Contributory factors include:  Negative Social Hx - Updated and reviewed. Contributory factors include: Negative Medications - reviewed   DATA REVIEWED: Prior records, left foot x-ray from 8/10  PHYSICAL EXAM:  VS: BP:132/76  HR: bpm  TEMP: ( )  RESP:   HT:5' 4.5" (163.8 cm)   WT:145 lb (65.8 kg)  BMI:24.51 PHYSICAL EXAM: Gen: NAD, alert, cooperative with exam, well-appearing HEENT: clear conjunctiva,  CV:  no edema, capillary refill brisk, normal rate Resp: non-labored Skin: no rashes, normal turgor  Neuro: no gross deficits.  Psych:  alert and oriented  Left Foot: Inspection:  Mild swelling and bruising over distal aspect of 4th metatarsal. Collapsed longitudinal and transverse arches  Palpation: TTP over dorsum of foot at 4th metatarsal and general metarsalgia ROM: Full  ROM of the ankle. Normal  midfoot flexibility Strength: 5/5 strength ankle in all planes Neurovascular: N/V intact distally in the lower extremity Special tests: Negative anterior drawer.  normal midfoot flexibility  Right Foot: Inspection:  No obvious bony deformity.  No swelling, erythema, or bruising. Collapsed longitudinal and transverse arches  Palpation: metatarsalgia  ROM: Full  ROM of the ankle. Normal midfoot flexibility Strength: 5/5 strength ankle in all planes Neurovascular: N/V intact distally in the lower extremity  US left foot, limited: 4th metatarsal images obtained with longitudinal and transverse planes. No cortical irregularity noted but evidence of neovascularization and hypoechoic changes at distal 4th metatarsal.   Impression: ultrasound findings consistent with possible distal 4th metatarsal stress fracture  ASSESSMENT & PLAN:  73 yo female presenting with left foot pain for the past month with ultrasound findings consistent with 4th metatarsal stress fracture, with neovascularization and hypoechoic changes. Given improvement in pain, it has already started to heal. Will provide post-op shoe to wear when foot is painful and instructed her to increase activity with pain as guide. Will follow up in 4 weeks and analyze foot structure, gait to see if there is mechanical reason for stress fracture.  Will likely provide inserts with metatarsal pads.  Patient seen and evaluated with the sports medicine fellow.  I agree with the above plan of care.  Ultrasound findings suggest a fourth metatarsal stress reaction or stress fracture.  Patient is improving.  I did give her a postop shoe to wear for comfort.  No running or recreational walking for now.  Follow-up in 4 weeks for reevaluation and repeat ultrasound.  She may benefit from inserts in her shoes.  We will plan on doing a gait analysis at follow-up.

## 2019-06-19 ENCOUNTER — Encounter: Payer: Self-pay | Admitting: Sports Medicine

## 2019-06-22 DIAGNOSIS — H5203 Hypermetropia, bilateral: Secondary | ICD-10-CM | POA: Diagnosis not present

## 2019-06-22 DIAGNOSIS — E119 Type 2 diabetes mellitus without complications: Secondary | ICD-10-CM | POA: Diagnosis not present

## 2019-07-16 ENCOUNTER — Encounter: Payer: Self-pay | Admitting: Sports Medicine

## 2019-07-16 ENCOUNTER — Ambulatory Visit (INDEPENDENT_AMBULATORY_CARE_PROVIDER_SITE_OTHER): Payer: Medicare Other | Admitting: Sports Medicine

## 2019-07-16 ENCOUNTER — Other Ambulatory Visit: Payer: Self-pay

## 2019-07-16 DIAGNOSIS — M84375D Stress fracture, left foot, subsequent encounter for fracture with routine healing: Secondary | ICD-10-CM

## 2019-07-16 NOTE — Patient Instructions (Signed)
It was a pleasure to see you today!  To summarize our discussion for this visit:  Today we are providing you with some shoe inserts and pads for cushioning.  You can wear these with your normal walking shoes and discontinue the boot as you are improving.  Continue to rest your foot for another short.  As you are still tender and healing and can start your walking regimen at approximately 50% of your previous distance.  If you are able to tolerate that activity level well increase your distance by 10 %/week and listen to your body for signs that the injury has been aggravated.  Return to the sports clinic if you feel like your injury is getting worsened or not improving.  Some additional health maintenance measures we should update are: Health Maintenance Due  Topic Date Due  . Hepatitis C Screening  1946/08/06  . FOOT EXAM  05/26/1957  . OPHTHALMOLOGY EXAM  05/26/1957  . TETANUS/TDAP  05/26/1966  . COLONOSCOPY  05/26/1997  . DEXA SCAN  05/26/2012  . PNA vac Low Risk Adult (1 of 2 - PCV13) 05/26/2012  . HEMOGLOBIN A1C  07/02/2017  . INFLUENZA VACCINE  05/23/2019  .    Call the clinic at [phone] if your symptoms worsen or you have any concerns.   Thank you for allowing me to take part in your care,  Dr. Doristine Mango

## 2019-07-16 NOTE — Assessment & Plan Note (Addendum)
Clinical exam and symptoms improved from previous encounter Ultrasound in office today demonstrates appropriate healing without inflammation Patient continues to have mild tenderness to metatarsal squeeze so we will continue to recommend rest at this time -Discontinue hard walking shoe and return to regular shoes -When patient able to tolerate ambulation in regular shoe without discomfort encourage patient to start walking regiment at about 50% of previous pace and if able to tolerate that we will increase up to 10 %/week until back to regular activity level. -Provided patient with shoe inserts and metatarsal pads -Follow-up as needed

## 2019-07-16 NOTE — Progress Notes (Signed)
PCP: Glenis Smoker, MD  Subjective:   CC: Patient is a 73 y.o. female here for follow-up of left fourth metatarsal stress fracture.  HPI: Patient returns today for follow-up with great improvement.  Patient states that she was adherent to wearing the hard and walking shoe and had no discomfort so discontinued use 2 days ago.  Patient has been wearing her regular walking shoe since then without any discomfort.  Patient states that she has not had an issue with swelling but is concerned that there is still some discoloration of her foot.  Patient is anxious to get back into a regular walking routine.  ROS: See HPI above. I have personally reviewed pertinent past medical history, surgical, family, and social history as appropriate.     Objective:  BP 138/66   Ht 5\' 4"  (1.626 m)   Wt 145 lb (65.8 kg)   BMI 24.89 kg/m   Physical Exam: Gen: NAD, comfortable in exam room Left foot: Observation: Negative for edema, malformation.  Mild Pes cavus Palpation: Positive for mild tenderness to palpation along mid fourth metatarsal shaft.  Negative for fluid collection ROM: Intact Strength: 5 out of 5 bilateral Sensation: Normal Special tests:positive metatarsal squeeze. Gait: Patient able to walk barefoot without pain or abnormality  Ultrasound: Scan of fourth metatarsal resulted in some tenderness to pressure over mid to proximal shaft, however, no edema was appreciated and good cortical calcification was appreciated indicating good healing of the previous fracture site.   Assessment & Plan:  Metatarsal stress fracture of left foot with routine healing Clinical exam and symptoms improved from previous encounter Ultrasound in office today demonstrates appropriate healing without inflammation Patient continues to have mild tenderness to metatarsal squeeze so we will continue to recommend rest at this time -Discontinue hard walking shoe and return to regular shoes -When patient able to  tolerate ambulation in regular shoe without discomfort encourage patient to start walking regiment at about 50% of previous pace and if able to tolerate that we will increase up to 10 %/week until back to regular activity level. -Provided patient with shoe inserts and metatarsal pads -Follow-up as needed  I independently examined pertinent imaging in relation to cc.  Doristine Mango, [place] [facility] PGY-2  Patient seen and evaluated with the resident.  I agree with the above plan of care.  Patient's fourth metatarsal stress fracture is healing although not completely healed at this point.  She may wean from her postop shoe as symptoms allow.  I would like for her to ambulate normally for 2 straight weeks before returning to running.  We did give her some green sports insoles with metatarsal pads for her cavus foot and she will put these in her running shoes.  As long as symptoms continue to improve she will follow-up as needed.

## 2019-07-17 ENCOUNTER — Other Ambulatory Visit: Payer: Self-pay | Admitting: Cardiovascular Disease

## 2019-07-24 DIAGNOSIS — Z23 Encounter for immunization: Secondary | ICD-10-CM | POA: Diagnosis not present

## 2019-08-17 ENCOUNTER — Other Ambulatory Visit: Payer: Self-pay | Admitting: Cardiovascular Disease

## 2019-08-21 ENCOUNTER — Ambulatory Visit (INDEPENDENT_AMBULATORY_CARE_PROVIDER_SITE_OTHER): Payer: Medicare Other | Admitting: Cardiovascular Disease

## 2019-08-21 ENCOUNTER — Other Ambulatory Visit: Payer: Self-pay

## 2019-08-21 ENCOUNTER — Encounter: Payer: Self-pay | Admitting: Cardiovascular Disease

## 2019-08-21 VITALS — BP 126/80 | HR 73 | Ht 64.0 in | Wt 146.4 lb

## 2019-08-21 DIAGNOSIS — E782 Mixed hyperlipidemia: Secondary | ICD-10-CM | POA: Diagnosis not present

## 2019-08-21 DIAGNOSIS — I10 Essential (primary) hypertension: Secondary | ICD-10-CM

## 2019-08-21 LAB — HEPATIC FUNCTION PANEL
ALT: 35 IU/L — ABNORMAL HIGH (ref 0–32)
AST: 30 IU/L (ref 0–40)
Albumin: 4.9 g/dL — ABNORMAL HIGH (ref 3.7–4.7)
Alkaline Phosphatase: 64 IU/L (ref 39–117)
Bilirubin Total: 0.5 mg/dL (ref 0.0–1.2)
Bilirubin, Direct: 0.15 mg/dL (ref 0.00–0.40)
Total Protein: 7.2 g/dL (ref 6.0–8.5)

## 2019-08-21 LAB — BASIC METABOLIC PANEL
BUN/Creatinine Ratio: 18 (ref 12–28)
BUN: 11 mg/dL (ref 8–27)
CO2: 24 mmol/L (ref 20–29)
Calcium: 10.1 mg/dL (ref 8.7–10.3)
Chloride: 98 mmol/L (ref 96–106)
Creatinine, Ser: 0.62 mg/dL (ref 0.57–1.00)
GFR calc Af Amer: 104 mL/min/{1.73_m2} (ref >59)
GFR calc non Af Amer: 90 mL/min/{1.73_m2} (ref >59)
Glucose: 113 mg/dL — ABNORMAL HIGH (ref 65–99)
Potassium: 4.1 mmol/L (ref 3.5–5.2)
Sodium: 140 mmol/L (ref 134–144)

## 2019-08-21 LAB — LIPID PANEL
Chol/HDL Ratio: 2.4 ratio (ref 0.0–4.4)
Cholesterol, Total: 225 mg/dL — ABNORMAL HIGH (ref 100–199)
HDL: 94 mg/dL (ref >39)
LDL Chol Calc (NIH): 111 mg/dL — ABNORMAL HIGH (ref 0–99)
Triglycerides: 120 mg/dL (ref 0–149)
VLDL Cholesterol Cal: 20 mg/dL (ref 5–40)

## 2019-08-21 NOTE — Patient Instructions (Signed)
Medication Instructions:  Your physician recommends that you continue on your current medications as directed. Please refer to the Current Medication list given to you today.  *If you need a refill on your cardiac medications before your next appointment, please call your pharmacy*  Lab Work: TODAY - cholesterol, liver panel, basic metabolic panel  If you have labs (blood work) drawn today and your tests are completely normal, you will receive your results only by: Marland Kitchen MyChart Message (if you have MyChart) OR . A paper copy in the mail If you have any lab test that is abnormal or we need to change your treatment, we will call you to review the results.   Testing/Procedures: None Ordered   Follow-Up: At [facility], you and your health needs are our priority.  As part of our continuing mission to provide you with exceptional heart care, we have created designated Provider Care Teams.  These Care Teams include your primary Cardiologist (physician) and Advanced Practice Providers (APPs -  Physician Assistants and Nurse Practitioners) who all work together to provide you with the care you need, when you need it.  Your next appointment:   12 months  The format for your next appointment:   In Person  Provider:   You may see Mertie Moores, MD or one of the following Advanced Practice Providers on your designated Care Team:    Richardson Dopp, PA-C  [place], [place]  Daune Perch, [place]

## 2019-08-21 NOTE — Progress Notes (Signed)
Melissa Raymond Date of Birth  1946-07-07       [facility] Office [address], [place], [place]  [address]   [place], [place]  [address] [place]   Fax  [phone]     Fax [phone]   Previous patient of Dr. Verl Blalock  Problem List: 1. Hyperlipidemia 2.  Diabetes Mellitus 3. Borderline HTN     Melissa Raymond is a 73 yo with hx of hyperlipidemia.   Strong family hx of CVA.   She is healthy.   She does elliptical and walks regularly.  Does yoga regularly.   Has lost 8 lbs in the past 2-3 weeks because her cholesterol level was up.    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 30, 2015:  Melissa Raymond is a 73 yo who we see for hx of hyperlipidemia. Exercising regularly  Did have a back injury but i getting back into working out   Jan. 24, 2017:  Melissa Raymond is seen today for follow up of her hyperlipidemia.  Getting some exercise.  Tolerating the Atorvastatin well.  Under lots of stress.   Jan. 24, 2018:  Under lots of stress.    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   ( [employer] )  [facility].  Sept. 10, 2018:  Melissa Raymond is seen today .   Under lots of stress recently  No CP or dyspnea.    BP has been mostly OK Does not eat salt .    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  She is seen for follow-up visit.  She has a history of hypertension and hypercholesterolemia. Has been walking some .   Current Outpatient Medications on File Prior to Visit  Medication Sig Dispense Refill  . aspirin EC 81 MG tablet Take 1 tablet (81 mg total) by  mouth daily. 90 tablet 3  . hydrocortisone cream 1 % Apply 1 application topically 2 (two) times daily as needed (skin rash).     Marland Kitchen losartan (COZAAR) 50 MG tablet Take 50 mg by mouth daily.    . metFORMIN (GLUCOPHAGE-XR) 500 MG 24 hr tablet Take 500 mg by mouth every evening. With evening meal  5  . Multiple Vitamin (MULTIVITAMIN WITH MINERALS) TABS tablet Take 1 tablet by mouth daily.    . rosuvastatin (CRESTOR) 10 MG tablet Take 1 tablet (10 mg total) by mouth daily. Please keep upcoming appt in October before anymore refills. Thank you 90 tablet 0   No current facility-administered medications on file prior to visit.     Allergies  Allergen Reactions  . Penicillins Swelling and Rash    Has patient had a PCN reaction causing immediate rash, facial/tongue/throat swelling, SOB or lightheadedness with hypotension: Yes Has patient had a PCN reaction causing severe rash involving mucus membranes or skin necrosis: No Has patient had a PCN reaction that required hospitalization No Has patient had a PCN reaction occurring within the  last 10 years: No If all of the above answers are "NO", then may proceed with Cephalosporin use.     Past Medical History:  Diagnosis Date  . Arthritis    hands  . Breast mass, right   . DM (diabetes mellitus) ([place])   . HTN (hypertension)   . Hyperlipidemia     Past Surgical History:  Procedure Laterality Date  . ABDOMINAL HYSTERECTOMY    . BREAST BIOPSY Right    negative  . BREAST LUMPECTOMY WITH RADIOACTIVE SEED LOCALIZATION Right 08/07/2018   Procedure: RIGHT BREAST LUMPECTOMY WITH RADIOACTIVE SEED LOCALIZATION;  Surgeon: Erroll Luna, MD;  Location: [place];  Service: General;  Laterality: Right;  . FLEXIBLE SIGMOIDOSCOPY N/A 12/30/2016   Procedure: FLEXIBLE SIGMOIDOSCOPY;  Surgeon: Teena Irani, MD;  Location: [facility] ENDOSCOPY;  Service: Endoscopy;  Laterality: N/A;    Social History   Tobacco Use  Smoking Status Never Smoker   Smokeless Tobacco Never Used    Social History   Substance and Sexual Activity  Alcohol Use Yes   Comment: social    Family History  Problem Relation Age of Onset  . Breast cancer Maternal Aunt     Reviw of Systems:  Reviewed in the HPI.  All other systems are negative.  Physical Exam: Blood pressure 126/80, pulse 73, height 5\' 4"  (1.626 m), weight 146 lb 6.4 oz (66.4 kg), SpO2 98 %. Wt Readings from Last 3 Encounters:  08/21/19 146 lb 6.4 oz (66.4 kg)  07/16/19 145 lb (65.8 kg)  06/18/19 145 lb (65.8 kg)   Physical Exam: Blood pressure 126/80, pulse 73, height 5\' 4"  (1.626 m), weight 146 lb 6.4 oz (66.4 kg), SpO2 98 %.  GEN:  Well nourished, well developed in no acute distress HEENT: Normal NECK: No JVD; No carotid bruits LYMPHATICS: No lymphadenopathy CARDIAC: RRR , no murmurs, rubs, gallops RESPIRATORY:  Clear to auscultation without rales, wheezing or rhonchi  ABDOMEN: Soft, non-tender, non-distended MUSCULOSKELETAL:  No edema; No deformity  SKIN: Warm and dry NEUROLOGIC:  Alert and oriented x 3   ECG: August 21, 2019: Normal sinus rhythm at 73.  Poor R wave progression-likely due to lead placement.   Assessment / Plan:   1. Hyperlipidemia-   On crestor.   Check labs today     2. Hypertension:      BP looks great . Continue meds.   Will see in 1 year     Mertie Moores, MD  08/21/2019 12:07 PM    [address],  [place], [place]  [address] Pager [phone] Phone: [phone]; Fax: [phone]

## 2019-11-13 ENCOUNTER — Ambulatory Visit: Payer: Medicare Other | Attending: Internal Medicine

## 2019-11-13 DIAGNOSIS — Z23 Encounter for immunization: Secondary | ICD-10-CM

## 2019-11-13 NOTE — Progress Notes (Signed)
   Covid-19 Vaccination Clinic  Name:  Ronesha Uranga    MRN: [id number] DOB: 08-Jul-1946  11/13/2019  Ms. Gartner was observed post Covid-19 immunization for 15 minutes without incidence. She was provided with Vaccine Information Sheet and instruction to access the V-Safe system.   Ms. Rasul was instructed to call 911 with any severe reactions post vaccine: Marland Kitchen Difficulty breathing  . Swelling of your face and throat  . A fast heartbeat  . A bad rash all over your body  . Dizziness and weakness    Immunizations Administered    Name Date Dose VIS Date Route   Pfizer COVID-19 Vaccine 11/13/2019 10:47 AM 0.3 mL 10/02/2019 Intramuscular   Manufacturer: [place]   Lot: [id number]   [place]: [id number]

## 2019-11-17 ENCOUNTER — Other Ambulatory Visit: Payer: Self-pay | Admitting: Cardiovascular Disease

## 2019-11-17 MED ORDER — ROSUVASTATIN CALCIUM 10 MG PO TABS: 10.0000 mg | ORAL_TABLET | Freq: Every day | ORAL | 2 refills | Status: DC

## 2019-12-04 ENCOUNTER — Ambulatory Visit: Payer: Medicare Other | Attending: Internal Medicine

## 2019-12-04 DIAGNOSIS — Z23 Encounter for immunization: Secondary | ICD-10-CM

## 2019-12-04 NOTE — Progress Notes (Signed)
   Covid-19 Vaccination Clinic  Name:  Melissa Raymond    MRN: [id number] DOB: 26-Aug-1946  12/04/2019  [name] was observed post Covid-19 immunization for 30 minutes based on pre-vaccination screening without incidence. She was provided with Vaccine Information Sheet and instruction to access the V-Safe system.   [name] was instructed to call 911 with any severe reactions post vaccine: Marland Kitchen Difficulty breathing  . Swelling of your face and throat  . A fast heartbeat  . A bad rash all over your body  . Dizziness and weakness    Immunizations Administered    Name Date Dose VIS Date Route   Pfizer COVID-19 Vaccine 12/04/2019  2:49 PM 0.3 mL 10/02/2019 Intramuscular   Manufacturer: [place]   Lot: [id number]   [place]: [id number]

## 2020-06-03 ENCOUNTER — Other Ambulatory Visit: Payer: Self-pay | Admitting: Family Medicine

## 2020-06-03 DIAGNOSIS — Z1231 Encounter for screening mammogram for malignant neoplasm of breast: Secondary | ICD-10-CM

## 2020-06-15 ENCOUNTER — Other Ambulatory Visit: Payer: Self-pay

## 2020-06-15 ENCOUNTER — Ambulatory Visit
Admission: RE | Admit: 2020-06-15 | Discharge: 2020-06-15 | Disposition: A | Discharge status: Home / Self Care | Payer: Medicare Other | Source: Ambulatory Visit | Attending: Family Medicine | Admitting: Family Medicine

## 2020-06-15 DIAGNOSIS — Z1231 Encounter for screening mammogram for malignant neoplasm of breast: Secondary | ICD-10-CM

## 2020-08-16 ENCOUNTER — Ambulatory Visit: Payer: Medicare Other

## 2020-08-25 ENCOUNTER — Encounter: Payer: Self-pay | Admitting: Cardiovascular Disease

## 2020-08-25 ENCOUNTER — Ambulatory Visit (INDEPENDENT_AMBULATORY_CARE_PROVIDER_SITE_OTHER): Payer: Medicare Other | Admitting: Cardiovascular Disease

## 2020-08-25 ENCOUNTER — Other Ambulatory Visit: Payer: Self-pay

## 2020-08-25 VITALS — BP 140/80 | HR 69 | Ht 64.0 in | Wt 130.8 lb

## 2020-08-25 DIAGNOSIS — E782 Mixed hyperlipidemia: Secondary | ICD-10-CM | POA: Diagnosis not present

## 2020-08-25 DIAGNOSIS — I10 Essential (primary) hypertension: Secondary | ICD-10-CM | POA: Diagnosis not present

## 2020-08-25 DIAGNOSIS — E785 Hyperlipidemia, unspecified: Secondary | ICD-10-CM | POA: Diagnosis not present

## 2020-08-25 MED ORDER — LOSARTAN POTASSIUM 100 MG PO TABS: 100.0000 mg | ORAL_TABLET | Freq: Every day | ORAL | 3 refills | Status: DC

## 2020-08-25 NOTE — Patient Instructions (Addendum)
Medication Instructions:  Your physician has recommended you make the following change in your medication:  INCREASE Losartan to 100 mg once daily  *If you need a refill on your cardiac medications before your next appointment, please call your pharmacy*   Lab Work: Your physician recommends that you return for lab work in: 3-4 weeks for cholesterol, liver panel, basic metabolic panel  You will need to FAST for this appointment - nothing to eat or drink after midnight the night before except water.  If you have labs (blood work) drawn today and your tests are completely normal, you will receive your results only by: Marland Kitchen MyChart Message (if you have MyChart) OR . A paper copy in the mail If you have any lab test that is abnormal or we need to change your treatment, we will call you to review the results.   Testing/Procedures: None Ordered   Follow-Up: At [facility], you and your health needs are our priority.  As part of our continuing mission to provide you with exceptional heart care, we have created designated Provider Care Teams.  These Care Teams include your primary Cardiologist (physician) and Advanced Practice Providers (APPs -  Physician Assistants and Nurse Practitioners) who all work together to provide you with the care you need, when you need it.  We recommend signing up for the patient portal called "MyChart".  Sign up information is provided on this After Visit Summary.  MyChart is used to connect with patients for Virtual Visits (Telemedicine).  Patients are able to view lab/test results, encounter notes, upcoming appointments, etc.  Non-urgent messages can be sent to your provider as well.   To learn more about what you can do with MyChart, go to [email or web address].    Your next appointment:   3 month(s)  The format for your next appointment:   In Person  Provider:   You will see one of the following Advanced Practice Providers on your designated Care Team:     Richardson Dopp, PA-C  [place], PA-C    Other Instructions HOW TO TAKE YOUR BLOOD PRESSURE:  Rest 5 minutes before taking your blood pressure.   Don't smoke or drink caffeinated beverages for at least 30 minutes before.  Take your blood pressure before (not after) you eat.  Sit comfortably with your back supported and both feet on the floor (don't cross your legs).  Elevate your arm to heart level on a table or a desk.  Use the proper sized cuff. It should fit smoothly and snugly around your bare upper arm. There should be enough room to slip a fingertip under the cuff. The bottom edge of the cuff should be 1 inch above the crease of the elbow.

## 2020-08-25 NOTE — Progress Notes (Signed)
Melissa Raymond Date of Birth  [birth year]       [facility] Office [address], [place], [place]  [address]   [place], [place]  [address] [place]   Fax  [phone]     Fax [phone]   Previous patient of Dr. Verl Blalock  Problem List: 1. Hyperlipidemia 2.  Diabetes Mellitus 3. Borderline HTN     Melissa Raymond is a 74 yo with hx of hyperlipidemia.   Strong family hx of CVA.   She is healthy.   She does elliptical and walks regularly.  Does yoga regularly.   Has lost 8 lbs in the past 2-3 weeks because her cholesterol level was up.    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 30, 2015:  Melissa Raymond is a 74 yo who we see for hx of hyperlipidemia. Exercising regularly  Did have a back injury but i getting back into working out   Jan. 24, 2017:  Melissa Raymond is seen today for follow up of her hyperlipidemia.  Getting some exercise.  Tolerating the Atorvastatin well.  Under lots of stress.   Jan. 24, 2018:  Under lots of stress.    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   ( [employer] )  [facility]  Sept. 10, 2018:  Melissa Raymond is seen today .   Under lots of stress recently  No CP or dyspnea.    BP has been mostly OK Does not eat salt .    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  She is seen for follow-up visit.  She has a history of hypertension and hypercholesterolemia. Has been walking some .   Nov. 4, 2021:  Melissa Raymond is seen today for follow up of her hyperlipidemia and diabetes mellitus. Tries to avid salt  Wt today is 130.8 ( down 16  lbs from last year )  Exercising regularly     Current Outpatient Medications on File Prior to Visit  Medication Sig Dispense Refill  . aspirin EC 81 MG tablet Take 1 tablet (81 mg total) by mouth daily. 90 tablet 3  . hydrocortisone cream 1 % Apply 1 application topically 2 (two) times daily as needed (skin rash).     Marland Kitchen losartan (COZAAR) 50 MG tablet Take 50 mg by mouth daily.    . metFORMIN (GLUCOPHAGE-XR) 500 MG 24 hr tablet Take 500 mg by mouth every evening. With evening meal  5  . Multiple Vitamin (MULTIVITAMIN WITH MINERALS) TABS tablet Take 1 tablet by mouth daily.    . rosuvastatin (CRESTOR) 10 MG tablet Take 1 tablet (10 mg total) by mouth daily. 90 tablet 2   No current facility-administered medications on file prior to visit.    Allergies  Allergen Reactions  . Penicillins Swelling and Rash    Has patient had a PCN reaction causing immediate rash, facial/tongue/throat swelling, SOB or lightheadedness with hypotension: Yes Has patient had a PCN reaction  causing severe rash involving mucus membranes or skin necrosis: No Has patient had a PCN reaction that required hospitalization No Has patient had a PCN reaction occurring within the last 10 years: No If all of the above answers are "NO", then may proceed with Cephalosporin use.     Past Medical History:  Diagnosis Date  . Arthritis    hands  . Breast mass, right   . DM (diabetes mellitus) ([place])   . HTN (hypertension)   . Hyperlipidemia     Past Surgical History:  Procedure Laterality Date  . ABDOMINAL HYSTERECTOMY    . BREAST BIOPSY Right    negative  . BREAST LUMPECTOMY WITH RADIOACTIVE SEED LOCALIZATION Right 08/07/2018   Procedure: RIGHT BREAST LUMPECTOMY WITH RADIOACTIVE SEED LOCALIZATION;  Surgeon: Erroll Luna, MD;  Location: [place];  Service: General;  Laterality: Right;  . FLEXIBLE SIGMOIDOSCOPY N/A 12/30/2016   Procedure: FLEXIBLE SIGMOIDOSCOPY;  Surgeon: Teena Irani, MD;   Location: [facility] ENDOSCOPY;  Service: Endoscopy;  Laterality: N/A;    Social History   Tobacco Use  Smoking Status Never Smoker  Smokeless Tobacco Never Used    Social History   Substance and Sexual Activity  Alcohol Use Yes   Comment: social    Family History  Problem Relation Age of Onset  . Breast cancer Maternal Aunt     Reviw of Systems:  Reviewed in the HPI.  All other systems are negative.  Physical Exam: Blood pressure 140/80, pulse 69, height 5\' 4"  (1.626 m), weight 130 lb 12.8 oz (59.3 kg), SpO2 97 %. Wt Readings from Last 3 Encounters:  08/25/20 130 lb 12.8 oz (59.3 kg)  08/21/19 146 lb 6.4 oz (66.4 kg)  07/16/19 145 lb (65.8 kg)    Physical Exam: Blood pressure 140/80, pulse 69, height 5\' 4"  (1.626 m), weight 130 lb 12.8 oz (59.3 kg), SpO2 97 %.  GEN:  Well nourished, well developed in no acute distress HEENT: Normal NECK: No JVD; No carotid bruits LYMPHATICS: No lymphadenopathy CARDIAC: RRR , no murmurs, rubs, gallops RESPIRATORY:  Clear to auscultation without rales, wheezing or rhonchi  ABDOMEN: Soft, non-tender, non-distended MUSCULOSKELETAL:  No edema; No deformity  SKIN: Warm and dry NEUROLOGIC:  Alert and oriented x 3   ECG:   August 25, 2020: Normal sinus rhythm at 69.  No ST or T wave changes.   Assessment / Plan:   1. Hyperlipidemia-   Will check lipids, liver enz, bmp in a month    2. Hypertension:     BP is a bit elevated ,  Will increase her Losartan to 100 mg a day .  Bmp in 1 month.  She will get a BP cuff and record BP .   Will see in 1 year     Mertie Moores, MD  08/25/2020 10:11 AM    [address],  [place], [place]  [address] Pager [phone] Phone: [phone]; Fax: [phone]

## 2020-09-29 ENCOUNTER — Other Ambulatory Visit: Payer: Medicare Other | Admitting: *Deleted

## 2020-09-29 ENCOUNTER — Other Ambulatory Visit: Payer: Self-pay

## 2020-09-29 DIAGNOSIS — I10 Essential (primary) hypertension: Secondary | ICD-10-CM

## 2020-09-29 DIAGNOSIS — E782 Mixed hyperlipidemia: Secondary | ICD-10-CM

## 2020-09-29 LAB — LIPID PANEL

## 2020-09-29 LAB — HEPATIC FUNCTION PANEL

## 2020-09-29 LAB — BASIC METABOLIC PANEL

## 2020-09-30 LAB — BASIC METABOLIC PANEL
BUN/Creatinine Ratio: 14 (ref 12–28)
BUN: 9 mg/dL (ref 8–27)
CO2: 25 mmol/L (ref 20–29)
Calcium: 9.9 mg/dL (ref 8.7–10.3)
Chloride: 100 mmol/L (ref 96–106)
Creatinine, Ser: 0.66 mg/dL (ref 0.57–1.00)
GFR calc Af Amer: 101 mL/min/{1.73_m2} (ref >59)
GFR calc non Af Amer: 88 mL/min/{1.73_m2} (ref >59)
Glucose: 114 mg/dL — ABNORMAL HIGH (ref 65–99)
Sodium: 140 mmol/L (ref 134–144)

## 2020-09-30 LAB — LIPID PANEL
Cholesterol, Total: 215 mg/dL — ABNORMAL HIGH (ref 100–199)
HDL: 115 mg/dL (ref >39)
LDL Chol Calc (NIH): 86 mg/dL (ref 0–99)
Triglycerides: 83 mg/dL (ref 0–149)
VLDL Cholesterol Cal: 14 mg/dL (ref 5–40)

## 2020-09-30 LAB — HEPATIC FUNCTION PANEL
ALT: 19 IU/L (ref 0–32)
Albumin: 4.6 g/dL (ref 3.7–4.7)
Alkaline Phosphatase: 57 IU/L (ref 44–121)
Bilirubin Total: 0.6 mg/dL (ref 0.0–1.2)
Bilirubin, Direct: 0.17 mg/dL (ref 0.00–0.40)
Total Protein: 6.9 g/dL (ref 6.0–8.5)

## 2020-10-04 ENCOUNTER — Other Ambulatory Visit: Payer: Self-pay | Admitting: Cardiovascular Disease

## 2020-10-18 DIAGNOSIS — F3341 Major depressive disorder, recurrent, in partial remission: Secondary | ICD-10-CM | POA: Diagnosis not present

## 2020-10-18 DIAGNOSIS — Z7289 Other problems related to lifestyle: Secondary | ICD-10-CM | POA: Diagnosis not present

## 2020-10-18 DIAGNOSIS — E7800 Pure hypercholesterolemia, unspecified: Secondary | ICD-10-CM | POA: Diagnosis not present

## 2020-10-18 DIAGNOSIS — M8580 Other specified disorders of bone density and structure, unspecified site: Secondary | ICD-10-CM | POA: Diagnosis not present

## 2020-10-18 DIAGNOSIS — E119 Type 2 diabetes mellitus without complications: Secondary | ICD-10-CM | POA: Diagnosis not present

## 2020-10-18 DIAGNOSIS — Z7984 Long term (current) use of oral hypoglycemic drugs: Secondary | ICD-10-CM | POA: Diagnosis not present

## 2020-10-18 DIAGNOSIS — I10 Essential (primary) hypertension: Secondary | ICD-10-CM | POA: Diagnosis not present

## 2020-10-18 DIAGNOSIS — Z20822 Contact with and (suspected) exposure to covid-19: Secondary | ICD-10-CM | POA: Diagnosis not present

## 2020-10-18 DIAGNOSIS — Z0000 Encounter for general adult medical examination without abnormal findings: Secondary | ICD-10-CM | POA: Diagnosis not present

## 2020-11-03 DIAGNOSIS — Z23 Encounter for immunization: Secondary | ICD-10-CM | POA: Diagnosis not present

## 2020-11-03 DIAGNOSIS — E119 Type 2 diabetes mellitus without complications: Secondary | ICD-10-CM | POA: Diagnosis not present

## 2020-11-05 ENCOUNTER — Other Ambulatory Visit: Payer: Self-pay | Admitting: Family Medicine

## 2020-11-05 DIAGNOSIS — M8580 Other specified disorders of bone density and structure, unspecified site: Secondary | ICD-10-CM

## 2020-12-01 NOTE — Progress Notes (Deleted)
Cardiology Office Note:    Date:  12/01/2020   ID:  Melissa Raymond, DOB 12/02/1945, MRN [id number]  PCP:  Glenis Smoker, MD   [place]  Cardiologist:  Mertie Moores, MD *** Advanced Practice Provider:  No care team member to display Electrophysiologist:  None  {Press F2 to show EP APP, CHF, sleep or structural heart MD               :[id number]}  { Click here to update then REFRESH NOTE - MD (PCP) or APP (Team Member)  Change PCP Type for MD, Specialty for APP is either Cardiology or Clinical Cardiac Electrophysiology  :[id number]}   Referring MD: Glenis Smoker, *   Chief Complaint:  No chief complaint on file.    Patient Profile:    Melissa Raymond is a 75 y.o. female with:   Coronary artery disease   Mild non-obs by Coronary CT in 6/19  Diabetes mellitus   Hyperlipidemia   Hypertension   Benign breast mass, s/p lumpectomy   Aortic atherosclerosis   Prior CV studies: Coronary CTA 03/31/2018 LM ostial 0-25 LAD proximal 0-25 Calcium score = 8 (0 percentile for age and sex matched control) Aortic atherosclerosis 13 mm left upper lobe nodule  History of Present Illness:    Melissa Raymond was last seen by Dr. Acie Fredrickson on 11/21.  Her losartan was adjusted for uncontrolled blood pressure.  She returns for follow-up.  ***      Past Medical History:  Diagnosis Date  . Arthritis    hands  . Breast mass, right   . DM (diabetes mellitus) ([place])   . HTN (hypertension)   . Hyperlipidemia     Current Medications: No outpatient medications have been marked as taking for the 12/02/20 encounter (Appointment) with Richardson Dopp T, PA-C.     Allergies:   Penicillins   Social History   Tobacco Use  . Smoking status: Never Smoker  . Smokeless tobacco: Never Used  Vaping Use  . Vaping Use: Never used  Substance Use Topics  . Alcohol use: Yes    Comment: social  . Drug use: No     Family Hx: The patient's family history  includes Breast cancer in her maternal aunt.  ROS   EKGs/Labs/Other Test Reviewed:    EKG:  EKG is *** ordered today.  The ekg ordered today demonstrates ***  Recent Labs: 09/29/2020: ALT 19; BUN 9; Creatinine, Ser 0.66; Potassium 4.6; Sodium 140   Recent Lipid Panel Lab Results  Component Value Date/Time   CHOL 215 (H) 09/29/2020 08:30 AM   TRIG 83 09/29/2020 08:30 AM   HDL 115 09/29/2020 08:30 AM   CHOLHDL 1.9 09/29/2020 08:30 AM   CHOLHDL 2.3 11/15/2015 09:01 AM   LDLCALC 86 09/29/2020 08:30 AM      Risk Assessment/Calculations:   {Does this patient have ATRIAL FIBRILLATION?:[id number]}  Physical Exam:    VS:  There were no vitals taken for this visit.    Wt Readings from Last 3 Encounters:  08/25/20 130 lb 12.8 oz (59.3 kg)  08/21/19 146 lb 6.4 oz (66.4 kg)  07/16/19 145 lb (65.8 kg)     Physical Exam ***  ASSESSMENT & PLAN:    ***  {Are you ordering a CV Procedure (e.g. stress test, cath, DCCV, TEE, etc)?   Press F2        :[id number]}    Dispo:  No follow-ups on file.   Medication Adjustments/Labs and Tests Ordered:  Current medicines are reviewed at length with the patient today.  Concerns regarding medicines are outlined above.  Tests Ordered: No orders of the defined types were placed in this encounter.  Medication Changes: No orders of the defined types were placed in this encounter.   Signed, Richardson Dopp, PA-C  12/01/2020 5:12 PM    [facility] [place], [place],   [address] Phone: [phone]; Fax: [phone]

## 2020-12-02 ENCOUNTER — Ambulatory Visit: Payer: Medicare Other | Admitting: Physician Assistant

## 2020-12-02 DIAGNOSIS — E782 Mixed hyperlipidemia: Secondary | ICD-10-CM

## 2020-12-02 DIAGNOSIS — E119 Type 2 diabetes mellitus without complications: Secondary | ICD-10-CM

## 2020-12-02 DIAGNOSIS — I10 Essential (primary) hypertension: Secondary | ICD-10-CM

## 2020-12-02 DIAGNOSIS — I2510 Atherosclerotic heart disease of native coronary artery without angina pectoris: Secondary | ICD-10-CM

## 2020-12-06 DIAGNOSIS — N958 Other specified menopausal and perimenopausal disorders: Secondary | ICD-10-CM | POA: Diagnosis not present

## 2020-12-08 DIAGNOSIS — Z01812 Encounter for preprocedural laboratory examination: Secondary | ICD-10-CM | POA: Diagnosis not present

## 2020-12-13 DIAGNOSIS — D125 Benign neoplasm of sigmoid colon: Secondary | ICD-10-CM | POA: Diagnosis not present

## 2020-12-13 DIAGNOSIS — Z86010 Personal history of colonic polyps: Secondary | ICD-10-CM | POA: Diagnosis not present

## 2020-12-13 DIAGNOSIS — K5730 Diverticulosis of large intestine without perforation or abscess without bleeding: Secondary | ICD-10-CM | POA: Diagnosis not present

## 2020-12-13 DIAGNOSIS — Q438 Other specified congenital malformations of intestine: Secondary | ICD-10-CM | POA: Diagnosis not present

## 2020-12-16 DIAGNOSIS — D125 Benign neoplasm of sigmoid colon: Secondary | ICD-10-CM | POA: Diagnosis not present

## 2021-01-27 DIAGNOSIS — Z23 Encounter for immunization: Secondary | ICD-10-CM | POA: Diagnosis not present

## 2021-01-31 ENCOUNTER — Other Ambulatory Visit: Payer: Self-pay

## 2021-01-31 ENCOUNTER — Ambulatory Visit (INDEPENDENT_AMBULATORY_CARE_PROVIDER_SITE_OTHER): Payer: Medicare Other | Admitting: Cardiovascular Disease

## 2021-01-31 ENCOUNTER — Encounter: Payer: Self-pay | Admitting: Cardiovascular Disease

## 2021-01-31 VITALS — BP 136/70 | HR 68 | Ht 64.0 in | Wt 134.4 lb

## 2021-01-31 DIAGNOSIS — E782 Mixed hyperlipidemia: Secondary | ICD-10-CM

## 2021-01-31 DIAGNOSIS — I10 Essential (primary) hypertension: Secondary | ICD-10-CM

## 2021-01-31 NOTE — Progress Notes (Signed)
Melissa Raymond Date of Birth  06-13-1946       [facility] Office [address], [address], [place], [place]  [address]   [place], [place]  [address] [address]   Fax  [phone]     Fax [phone]   Previous patient of Dr. Verl Blalock  Problem List: 1. Hyperlipidemia 2.  Diabetes Mellitus 3. Borderline HTN     Melissa Raymond is a 75 yo with hx of hyperlipidemia.   Strong family hx of CVA.   She is healthy.   She does elliptical and walks regularly.  Does yoga regularly.   Has lost 8 lbs in the past 2-3 weeks because her cholesterol level was up.    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 30, 2015:  Melissa Raymond is a 75 yo who we see for hx of hyperlipidemia. Exercising regularly  Did have a back injury but i getting back into working out   Jan. 24, 2017:  Melissa Raymond is seen today for follow up of her hyperlipidemia.  Getting some exercise.  Tolerating the Atorvastatin well.  Under lots of stress.   Jan. 24, 2018:  Under lots of stress.    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   ( [employer] )  [facility]  Sept. 10, 2018:  Melissa Raymond is seen today .   Under lots of stress recently  No CP or dyspnea.    BP has been mostly OK Does not eat salt .    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  She is seen for follow-up visit.  She has a history of hypertension and hypercholesterolemia. Has been walking some .   Nov. 4, 2021:  Melissa Raymond is seen today for follow up of her hyperlipidemia and diabetes mellitus. Tries to avid salt  Wt today is 130.8 ( down 16  lbs from last year )  Exercising regularly   January 31, 2021:  Melissa Raymond seen today for follow-up of her hyperlipidemia and diabetes mellitus.  Her weight today is 134 pounds. Was to seem my PA recently, missed the appt due to covid scare.  walks about every day .   Current Outpatient Medications on File Prior to Visit  Medication Sig Dispense Refill  . aspirin EC 81 MG tablet Take 1 tablet (81 mg total) by mouth daily. 90 tablet 3  . Cholecalciferol (VITAMIN D3) 50 MCG (2000 UT) capsule Take 1 capsule by mouth daily.    Marland Kitchen estradiol (ESTRACE) 0.1 MG/GM vaginal cream Place 1 Applicatorful vaginally 2 (two) times a week.    . hydrocortisone cream 1 % Apply 1 application topically 2 (two) times daily as needed (skin rash).     Marland Kitchen losartan (COZAAR) 100 MG tablet Take 1 tablet (100 mg total) by mouth daily. 90 tablet 3  . metFORMIN (GLUCOPHAGE-XR) 500 MG 24 hr tablet Take 500 mg by mouth every evening. With evening meal  5  . Multiple Vitamin (  MULTIVITAMIN WITH MINERALS) TABS tablet Take 1 tablet by mouth daily.    . rosuvastatin (CRESTOR) 10 MG tablet TAKE 1 TABLET(10 MG) BY MOUTH DAILY 90 tablet 3   No current facility-administered medications on file prior to visit.    Allergies  Allergen Reactions  . Penicillins Swelling and Rash    Has patient had a PCN reaction causing immediate rash, facial/tongue/throat swelling, SOB or lightheadedness with hypotension: Yes Has patient had a PCN reaction causing severe rash involving mucus membranes or skin necrosis: No Has patient had a PCN reaction that required hospitalization No Has patient had a PCN reaction occurring within the last 10 years: No If all of the above answers are "NO", then may proceed with Cephalosporin use.     Past Medical History:  Diagnosis Date  . Arthritis    hands  . Breast mass, right   . DM (diabetes mellitus) ([place])   . HTN (hypertension)   . Hyperlipidemia     Past Surgical History:  Procedure Laterality Date  .  ABDOMINAL HYSTERECTOMY    . BREAST BIOPSY Right    negative  . BREAST LUMPECTOMY WITH RADIOACTIVE SEED LOCALIZATION Right 08/07/2018   Procedure: RIGHT BREAST LUMPECTOMY WITH RADIOACTIVE SEED LOCALIZATION;  Surgeon: Erroll Luna, MD;  Location: [place];  Service: General;  Laterality: Right;  . FLEXIBLE SIGMOIDOSCOPY N/A 12/30/2016   Procedure: FLEXIBLE SIGMOIDOSCOPY;  Surgeon: Teena Irani, MD;  Location: [facility] ENDOSCOPY;  Service: Endoscopy;  Laterality: N/A;    Social History   Tobacco Use  Smoking Status Never Smoker  Smokeless Tobacco Never Used    Social History   Substance and Sexual Activity  Alcohol Use Yes   Comment: social    Family History  Problem Relation Age of Onset  . Breast cancer Maternal Aunt     Reviw of Systems:  Reviewed in the HPI.  All other systems are negative.  Physical Exam: Blood pressure 136/70, pulse 68, height 5\' 4"  (1.626 m), weight 134 lb 6.4 oz (61 kg), SpO2 97 %. Wt Readings from Last 3 Encounters:  01/31/21 134 lb 6.4 oz (61 kg)  08/25/20 130 lb 12.8 oz (59.3 kg)  08/21/19 146 lb 6.4 oz (66.4 kg)    Physical Exam: Blood pressure 136/70, pulse 68, height 5\' 4"  (1.626 m), weight 134 lb 6.4 oz (61 kg), SpO2 97 %.  GEN:  Well nourished, well developed in no acute distress HEENT: Normal NECK: No JVD; No carotid bruits LYMPHATICS: No lymphadenopathy CARDIAC: RRR , no murmurs, rubs, gallops RESPIRATORY:  Clear to auscultation without rales, wheezing or rhonchi  ABDOMEN: Soft, non-tender, non-distended MUSCULOSKELETAL:  No edema; No deformity  SKIN: Warm and dry NEUROLOGIC:  Alert and oriented x 3    ECG:    Assessment / Plan:   1. Hyperlipidemia-    Labs look great from Dec.   Will see her agin in a year with lipids, alt, bmp    2. Hypertension:    BP looks great .    Will see in 1 year     Mertie Moores, MD  01/31/2021 8:48 AM    [address] [place],  [place], [place]  [address] Pager [phone] Phone: [phone]; Fax: [phone]

## 2021-01-31 NOTE — Patient Instructions (Signed)

## 2021-03-28 DIAGNOSIS — Z20822 Contact with and (suspected) exposure to covid-19: Secondary | ICD-10-CM | POA: Diagnosis not present

## 2021-04-04 ENCOUNTER — Ambulatory Visit
Admission: RE | Admit: 2021-04-04 | Discharge: 2021-04-04 | Disposition: A | Discharge status: Home / Self Care | Payer: Medicare Other | Source: Ambulatory Visit | Attending: Family Medicine | Admitting: Family Medicine

## 2021-04-04 ENCOUNTER — Other Ambulatory Visit: Payer: Self-pay

## 2021-04-04 DIAGNOSIS — M8580 Other specified disorders of bone density and structure, unspecified site: Secondary | ICD-10-CM

## 2021-04-04 DIAGNOSIS — Z780 Asymptomatic menopausal state: Secondary | ICD-10-CM | POA: Diagnosis not present

## 2021-04-04 DIAGNOSIS — M85851 Other specified disorders of bone density and structure, right thigh: Secondary | ICD-10-CM | POA: Diagnosis not present

## 2021-04-18 DIAGNOSIS — E119 Type 2 diabetes mellitus without complications: Secondary | ICD-10-CM | POA: Diagnosis not present

## 2021-04-18 DIAGNOSIS — I10 Essential (primary) hypertension: Secondary | ICD-10-CM | POA: Diagnosis not present

## 2021-04-18 DIAGNOSIS — M8589 Other specified disorders of bone density and structure, multiple sites: Secondary | ICD-10-CM | POA: Diagnosis not present

## 2021-04-18 DIAGNOSIS — E7800 Pure hypercholesterolemia, unspecified: Secondary | ICD-10-CM | POA: Diagnosis not present

## 2021-04-26 DIAGNOSIS — E119 Type 2 diabetes mellitus without complications: Secondary | ICD-10-CM | POA: Diagnosis not present

## 2021-04-26 DIAGNOSIS — G5762 Lesion of plantar nerve, left lower limb: Secondary | ICD-10-CM | POA: Diagnosis not present

## 2021-05-02 ENCOUNTER — Other Ambulatory Visit: Payer: Self-pay | Admitting: Family Medicine

## 2021-05-02 DIAGNOSIS — Z1231 Encounter for screening mammogram for malignant neoplasm of breast: Secondary | ICD-10-CM

## 2021-06-23 DIAGNOSIS — H2513 Age-related nuclear cataract, bilateral: Secondary | ICD-10-CM | POA: Diagnosis not present

## 2021-06-23 DIAGNOSIS — E119 Type 2 diabetes mellitus without complications: Secondary | ICD-10-CM | POA: Diagnosis not present

## 2021-06-23 DIAGNOSIS — H524 Presbyopia: Secondary | ICD-10-CM | POA: Diagnosis not present

## 2021-06-27 DIAGNOSIS — Z1231 Encounter for screening mammogram for malignant neoplasm of breast: Secondary | ICD-10-CM

## 2021-07-28 ENCOUNTER — Ambulatory Visit
Admission: RE | Admit: 2021-07-28 | Discharge: 2021-07-28 | Disposition: A | Discharge status: Home / Self Care | Payer: Medicare Other | Source: Ambulatory Visit | Attending: Family Medicine | Admitting: Family Medicine

## 2021-07-28 ENCOUNTER — Other Ambulatory Visit: Payer: Self-pay

## 2021-07-28 DIAGNOSIS — Z1231 Encounter for screening mammogram for malignant neoplasm of breast: Secondary | ICD-10-CM | POA: Diagnosis not present

## 2021-07-31 DIAGNOSIS — Z23 Encounter for immunization: Secondary | ICD-10-CM | POA: Diagnosis not present

## 2021-08-14 ENCOUNTER — Other Ambulatory Visit: Payer: Self-pay

## 2021-08-14 MED ORDER — LOSARTAN POTASSIUM 100 MG PO TABS: 100.0000 mg | ORAL_TABLET | Freq: Every day | ORAL | 3 refills | Status: DC

## 2021-08-21 DIAGNOSIS — Z23 Encounter for immunization: Secondary | ICD-10-CM | POA: Diagnosis not present

## 2021-09-03 ENCOUNTER — Other Ambulatory Visit: Payer: Self-pay | Admitting: Cardiovascular Disease

## 2021-10-24 DIAGNOSIS — Z0000 Encounter for general adult medical examination without abnormal findings: Secondary | ICD-10-CM | POA: Diagnosis not present

## 2021-10-24 DIAGNOSIS — F3341 Major depressive disorder, recurrent, in partial remission: Secondary | ICD-10-CM | POA: Diagnosis not present

## 2021-10-24 DIAGNOSIS — I10 Essential (primary) hypertension: Secondary | ICD-10-CM | POA: Diagnosis not present

## 2021-10-24 DIAGNOSIS — M810 Age-related osteoporosis without current pathological fracture: Secondary | ICD-10-CM | POA: Diagnosis not present

## 2021-10-24 DIAGNOSIS — E119 Type 2 diabetes mellitus without complications: Secondary | ICD-10-CM | POA: Diagnosis not present

## 2021-10-24 DIAGNOSIS — E7800 Pure hypercholesterolemia, unspecified: Secondary | ICD-10-CM | POA: Diagnosis not present

## 2021-11-09 DIAGNOSIS — L723 Sebaceous cyst: Secondary | ICD-10-CM | POA: Diagnosis not present

## 2021-11-09 DIAGNOSIS — L738 Other specified follicular disorders: Secondary | ICD-10-CM | POA: Diagnosis not present

## 2021-12-26 DIAGNOSIS — L723 Sebaceous cyst: Secondary | ICD-10-CM | POA: Diagnosis not present

## 2022-01-08 DIAGNOSIS — D485 Neoplasm of uncertain behavior of skin: Secondary | ICD-10-CM | POA: Diagnosis not present

## 2022-01-08 DIAGNOSIS — L720 Epidermal cyst: Secondary | ICD-10-CM | POA: Diagnosis not present

## 2022-02-15 ENCOUNTER — Encounter: Payer: Self-pay | Admitting: Cardiovascular Disease

## 2022-02-15 NOTE — Progress Notes (Signed)
? ? ? ?Arleatha Vernon ?Date of Birth  Jun 20, 1946 ?      ?[place]      ?[place], [address]     ?[place], [place]  [address]     ?[phone]      ?Fax  [phone]       ? ? ?Previous patient of Dr. Verl Blalock ? ?Problem List: ?1. Hyperlipidemia ?2.  Diabetes Mellitus ?3. Borderline HTN ? ?  ? ?[name] is a 76 yo with hx of hyperlipidemia.   ?Strong family hx of CVA.  ? ?She is healthy.   She does elliptical and walks regularly.  Does yoga regularly.   Has lost 8 lbs in the past 2-3 weeks because her cholesterol level was up.    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 ?Dec. 30, 2015: ? ?[name] is a 76 yo who we see for hx of hyperlipidemia. ?Exercising regularly  Did have a back injury but i getting back into working out  ? ?Jan. 24, 2017: ? ?[name] is seen today for follow up of her hyperlipidemia.  ?Getting some exercise.  ?Tolerating the Atorvastatin well.  ?Under lots of stress.  ? ?Jan. 24, 2018: ? ?Under lots of stress.    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   ( [employer] )  [place]. ? ?Sept. 10, 2018: ? ?[name] is seen today .   Under lots of stress recently  ?No CP or dyspnea.    ?BP has been mostly OK ?Does not eat salt .   ? ?Is now on metformin for new diagnosis of DM  ? 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 ?August 21, 2019: ?[name] is doing well.  She is seen for follow-up visit.  She has a history of hypertension and hypercholesterolemia. ?Has been walking some . ? ? ?Nov. 4, 2021: ? ?Deb is seen today for follow up of her hyperlipidemia and diabetes mellitus. ?Tries to avid salt  ?Wt today is 130.8 ( down 16 lbs from last year )  ?Exercising regularly  ? ?January 31, 2021:  ?Deb seen today for follow-up  of her hyperlipidemia and diabetes mellitus.  Her weight today is 134 pounds. ?Was to seem my PA recently, missed the appt due to covid scare. ? walks about every day . ? ? ?February 16, 2022 ?Deb is seen for follow up of her HLD and DM ?No CP or dyspnea  ?Coronary CT angiogram from June, 2019 revealed a coronary calcium score of 8.  This was 0 percentile for age and sex matched controls.  She had mild nonobstructive coronary artery disease of the proximal LAD. ? ?Current Outpatient Medications on File Prior to Visit  ?Medication Sig Dispense Refill  ? aspirin EC 81 MG tablet Take 1 tablet (81 mg total) by mouth daily. 90 tablet 3  ? Cholecalciferol (VITAMIN D3) 50 MCG (2000 UT) CAPS Take 2 capsules by mouth daily.    ? estradiol (ESTRACE) 0.1 MG/GM vaginal cream Place 1 Applicatorful vaginally 2 (two) times a week.    ? hydrocortisone cream 1 % Apply 1 application topically 2 (two) times daily as  needed (skin rash).     ? losartan (COZAAR) 100 MG tablet Take 1 tablet (100 mg total) by mouth daily. 90 tablet 3  ? metFORMIN (GLUCOPHAGE-XR) 500 MG 24 hr tablet Take 500 mg by mouth every evening. With evening meal  5  ? Multiple Vitamin (MULTIVITAMIN WITH MINERALS) TABS tablet Take 1 tablet by mouth daily.    ? rosuvastatin (CRESTOR) 10 MG tablet TAKE 1 TABLET(10 MG) BY MOUTH DAILY 90 tablet 3  ? ?No current facility-administered medications on file prior to visit.  ? ? ?Allergies  ?Allergen Reactions  ? Penicillins Swelling and Rash  ?  Has patient had a PCN reaction causing immediate rash, facial/tongue/throat swelling, SOB or lightheadedness with hypotension: Yes ?Has patient had a PCN reaction causing severe rash involving mucus membranes or skin necrosis: No ?Has patient had a PCN reaction that required hospitalization No ?Has patient had a PCN reaction occurring within the last 10 years: No ?If all of the above answers are "NO", then may proceed with Cephalosporin use. ?  ? ? ?Past Medical History:  ?Diagnosis Date   ? Arthritis   ? hands  ? Breast mass, right   ? DM (diabetes mellitus) ([place])   ? HTN (hypertension)   ? Hyperlipidemia   ? ? ?Past Surgical History:  ?Procedure Laterality Date  ? ABDOMINAL HYSTERECTOMY    ? BREAST BIOPSY Right   ? negative  ? BREAST LUMPECTOMY WITH RADIOACTIVE SEED LOCALIZATION Right 08/07/2018  ? Procedure: RIGHT BREAST LUMPECTOMY WITH RADIOACTIVE SEED LOCALIZATION;  Surgeon: Erroll Luna, MD;  Location: [place];  Service: General;  Laterality: Right;  ? FLEXIBLE SIGMOIDOSCOPY N/A 12/30/2016  ? Procedure: FLEXIBLE SIGMOIDOSCOPY;  Surgeon: Teena Irani, MD;  Location: [facility] ENDOSCOPY;  Service: Endoscopy;  Laterality: N/A;  ? ? ?Social History  ? ?Tobacco Use  ?Smoking Status Never  ?Smokeless Tobacco Never  ? ? ?Social History  ? ?Substance and Sexual Activity  ?Alcohol Use Yes  ? Comment: social  ? ? ?Family History  ?Problem Relation Age of Onset  ? Breast cancer Maternal Aunt   ? ? ?Reviw of Systems:  ?Reviewed in the HPI.  All other systems are negative. ? ? ?Physical Exam: ?Blood pressure (!) 146/78, pulse 66, height '5\' 4"'$  (1.626 m), weight 130 lb 9.6 oz (59.2 kg), SpO2 98 %. ? ?GEN:  Well nourished, well developed in no acute distress ?HEENT: Normal ?NECK: No JVD; No carotid bruits ?LYMPHATICS: No lymphadenopathy ?CARDIAC: RRR , no murmurs, rubs, gallops ?RESPIRATORY:  Clear to auscultation without rales, wheezing or rhonchi  ?ABDOMEN: Soft, non-tender, non-distended ?MUSCULOSKELETAL:  No edema; No deformity  ?SKIN: Warm and dry ?NEUROLOGIC:  Alert and oriented x 3 ? ? ?ECG:   February 16, 2022: Normal sinus rhythm at 66.  Poor R wave progression-likely lead placement. ? ? ?Assessment / Plan:  ? ?1. Hyperlipidemia-lipid levels look good.  Continue current medications.  Chronic calcium score was 8 several years ago. ? ? ? ?2. Hypertension:     ?Blood pressure is well controlled.  Continue current medications. ? ? ?Mertie Moores, MD  ?02/16/2022 10:32 AM    ?[facility] ?[place],  [address] ?[place], [place]  [address] ?Pager [phone] ?Phone: [phone]; Fax: [phone]  ? ? ? ?

## 2022-02-16 ENCOUNTER — Ambulatory Visit (INDEPENDENT_AMBULATORY_CARE_PROVIDER_SITE_OTHER): Payer: Medicare Other | Admitting: Cardiovascular Disease

## 2022-02-16 ENCOUNTER — Encounter: Payer: Self-pay | Admitting: Cardiovascular Disease

## 2022-02-16 VITALS — BP 146/78 | HR 66 | Ht 64.0 in | Wt 130.6 lb

## 2022-02-16 DIAGNOSIS — E782 Mixed hyperlipidemia: Secondary | ICD-10-CM | POA: Diagnosis not present

## 2022-02-16 NOTE — Patient Instructions (Signed)
Medication Instructions:  Your physician recommends that you continue on your current medications as directed. Please refer to the Current Medication list given to you today.  *If you need a refill on your cardiac medications before your next appointment, please call your pharmacy*   Follow-Up: At [facility], you and your health needs are our priority.  As part of our continuing mission to provide you with exceptional heart care, we have created designated Provider Care Teams.  These Care Teams include your primary Cardiologist (physician) and Advanced Practice Providers (APPs -  Physician Assistants and Nurse Practitioners) who all work together to provide you with the care you need, when you need it.  We recommend signing up for the patient portal called "MyChart".  Sign up information is provided on this After Visit Summary.  MyChart is used to connect with patients for Virtual Visits (Telemedicine).  Patients are able to view lab/test results, encounter notes, upcoming appointments, etc.  Non-urgent messages can be sent to your provider as well.   To learn more about what you can do with MyChart, go to [email or web address].    Your next appointment:   1 year(s)  The format for your next appointment:   In Person  Provider:   Philip Nahser, MD     Important Information About Sugar       

## 2022-03-01 DIAGNOSIS — Z23 Encounter for immunization: Secondary | ICD-10-CM | POA: Diagnosis not present

## 2022-04-16 DIAGNOSIS — L720 Epidermal cyst: Secondary | ICD-10-CM | POA: Diagnosis not present

## 2022-04-26 DIAGNOSIS — G5762 Lesion of plantar nerve, left lower limb: Secondary | ICD-10-CM | POA: Diagnosis not present

## 2022-04-26 DIAGNOSIS — E119 Type 2 diabetes mellitus without complications: Secondary | ICD-10-CM | POA: Diagnosis not present

## 2022-05-01 DIAGNOSIS — E119 Type 2 diabetes mellitus without complications: Secondary | ICD-10-CM | POA: Diagnosis not present

## 2022-05-01 DIAGNOSIS — R202 Paresthesia of skin: Secondary | ICD-10-CM | POA: Diagnosis not present

## 2022-05-01 DIAGNOSIS — I10 Essential (primary) hypertension: Secondary | ICD-10-CM | POA: Diagnosis not present

## 2022-06-18 NOTE — Progress Notes (Unsigned)
Name: Melissa Raymond  MRN/ DOB: [id number], 20-Apr-1946   Age/ Sex: 76 y.o., female    PCP: Glenis Smoker, MD   Reason for Endocrinology Evaluation: Type {NUMBERS 1 OR 2:522190} Diabetes Mellitus     Date of Initial Endocrinology Visit: 06/18/2022     PATIENT IDENTIFIER: Melissa Raymond is a 76 y.o. female with a past medical history of ***. The patient presented for initial endocrinology clinic visit on 06/18/2022 for consultative assistance with her diabetes management.    HPI: Ms. Zahm was    Diagnosed with DM *** Prior Medications tried/Intolerance: *** Currently checking blood sugars *** x / day,  before breakfast and ***.  Hypoglycemia episodes : ***               Symptoms: ***                 Frequency: ***/  Hemoglobin A1c has ranged from *** in ***, peaking at *** in ***. Patient required assistance for hypoglycemia:  Patient has required hospitalization within the last 1 year from hyper or hypoglycemia:   In terms of diet, the patient ***   HOME DIABETES REGIMEN: Basal: ***  Bolus: ***   Statin: {Yes/No:11203} ACE-I/ARB: {YES/NO:17245} Prior Diabetic Education: {Yes/No:11203}   METER DOWNLOAD SUMMARY: Date range evaluated: *** Fingerstick Blood Glucose Tests = *** Average Number Tests/Day = *** Overall Mean FS Glucose = *** Standard Deviation = ***  BG Ranges: Low = *** High = ***   Hypoglycemic Events/30 Days: BG < 50 = *** Episodes of symptomatic severe hypoglycemia = ***   DIABETIC COMPLICATIONS: Microvascular complications:  *** Denies: *** Last eye exam: Completed   Macrovascular complications:  *** Denies: CAD, PVD, CVA   PAST HISTORY: Past Medical History:  Past Medical History:  Diagnosis Date   Arthritis    hands   Breast mass, right    DM (diabetes mellitus) ([place])    HTN (hypertension)    Hyperlipidemia    Past Surgical History:  Past Surgical History:  Procedure Laterality Date   ABDOMINAL HYSTERECTOMY      BREAST BIOPSY Right    negative   BREAST LUMPECTOMY WITH RADIOACTIVE SEED LOCALIZATION Right 08/07/2018   Procedure: RIGHT BREAST LUMPECTOMY WITH RADIOACTIVE SEED LOCALIZATION;  Surgeon: Erroll Luna, MD;  Location: [place];  Service: General;  Laterality: Right;   FLEXIBLE SIGMOIDOSCOPY N/A 12/30/2016   Procedure: FLEXIBLE SIGMOIDOSCOPY;  Surgeon: Teena Irani, MD;  Location: [facility] ENDOSCOPY;  Service: Endoscopy;  Laterality: N/A;    Social History:  reports that she has never smoked. She has never used smokeless tobacco. She reports current alcohol use. She reports that she does not use drugs. Family History:  Family History  Problem Relation Age of Onset   Breast cancer Maternal Aunt      HOME MEDICATIONS: Allergies as of 06/19/2022       Reactions   Penicillins Swelling, Rash   Has patient had a PCN reaction causing immediate rash, facial/tongue/throat swelling, SOB or lightheadedness with hypotension: Yes Has patient had a PCN reaction causing severe rash involving mucus membranes or skin necrosis: No Has patient had a PCN reaction that required hospitalization No Has patient had a PCN reaction occurring within the last 10 years: No If all of the above answers are "NO", then may proceed with Cephalosporin use.        Medication List        Accurate as of June 18, 2022  8:43 PM. If  you have any questions, ask your nurse or doctor.          aspirin EC 81 MG tablet Take 1 tablet (81 mg total) by mouth daily.   estradiol 0.1 MG/GM vaginal cream Commonly known as: ESTRACE Place 1 Applicatorful vaginally 2 (two) times a week.   hydrocortisone cream 1 % Apply 1 application topically 2 (two) times daily as needed (skin rash).   losartan 100 MG tablet Commonly known as: COZAAR Take 1 tablet (100 mg total) by mouth daily.   metFORMIN 500 MG 24 hr tablet Commonly known as: GLUCOPHAGE-XR Take 500 mg by mouth every evening. With evening meal    multivitamin with minerals Tabs tablet Take 1 tablet by mouth daily.   rosuvastatin 10 MG tablet Commonly known as: CRESTOR TAKE 1 TABLET(10 MG) BY MOUTH DAILY   vitamin D3 50 MCG (2000 UT) Caps Take 2 capsules by mouth daily.         ALLERGIES: Allergies  Allergen Reactions   Penicillins Swelling and Rash    Has patient had a PCN reaction causing immediate rash, facial/tongue/throat swelling, SOB or lightheadedness with hypotension: Yes Has patient had a PCN reaction causing severe rash involving mucus membranes or skin necrosis: No Has patient had a PCN reaction that required hospitalization No Has patient had a PCN reaction occurring within the last 10 years: No If all of the above answers are "NO", then may proceed with Cephalosporin use.      REVIEW OF SYSTEMS: A comprehensive ROS was conducted with the patient and is negative except as per HPI and below:  ROS    OBJECTIVE:   VITAL SIGNS: There were no vitals taken for this visit.   PHYSICAL EXAM:  General: Pt appears well and is in NAD  Hydration: Well-hydrated with moist mucous membranes and good skin turgor  HEENT: Head: Unremarkable with good dentition. Oropharynx clear without exudate.  Eyes: External eye exam normal without stare, lid lag or exophthalmos.  EOM intact.  PERRL.  Neck: General: Supple without adenopathy or carotid bruits. Thyroid: Thyroid size normal.  No goiter or nodules appreciated. No thyroid bruit.  Lungs: Clear with good BS bilat with no rales, rhonchi, or wheezes  Heart: RRR with normal S1 and S2 and no gallops; no murmurs; no rub  Abdomen: Normoactive bowel sounds, soft, nontender, without masses or organomegaly palpable  Extremities:  Lower extremities - No pretibial edema. No lesions.  Skin: Normal texture and temperature to palpation. No rash noted. No Acanthosis nigricans/skin tags. No lipohypertrophy.  Neuro: MS is good with appropriate affect, pt is alert and Ox3    DM foot  exam:    DATA REVIEWED:  Lab Results  Component Value Date   HGBA1C 6.6 (H) 12/30/2016   Lab Results  Component Value Date   LDLCALC 86 09/29/2020   CREATININE 0.66 09/29/2020   No results found for: "MICRALBCREAT"  Lab Results  Component Value Date   CHOL 215 (H) 09/29/2020   HDL 115 09/29/2020   LDLCALC 86 09/29/2020   TRIG 83 09/29/2020   CHOLHDL 1.9 09/29/2020        ASSESSMENT / PLAN / RECOMMENDATIONS:   1) Type *** Diabetes Mellitus, ***controlled, With*** complications - Most recent A1c of *** %. Goal A1c < *** %.  ***  Plan: GENERAL: ***  MEDICATIONS: ***  EDUCATION / INSTRUCTIONS: BG monitoring instructions: Patient is instructed to check her blood sugars *** times a day, ***. Call [facility] if: BG persistently <  70 or > 300. I reviewed the Rule of 15 for the treatment of hypoglycemia in detail with the patient. Literature supplied.   2) Diabetic complications:  Eye: Does *** have known diabetic retinopathy.  Neuro/ Feet: Does *** have known diabetic peripheral neuropathy. Renal: Patient does *** have known baseline CKD. She is *** on an ACEI/ARB at present.Check urine albumin/creatinine ratio yearly starting at time of diagnosis. If albuminuria is positive, treatment is geared toward better glucose, blood pressure control and use of ACE inhibitors or ARBs. Monitor electrolytes and creatinine once to twice yearly.   3) Lipids: Patient is *** on a statin.    4) Hypertension: ***  at goal of < 140/90 mmHg.       Signed electronically by: Mack Guise, MD  [facility] Endocrinology  [facility] [address] [place], [place] [address] Phone: [phone] FAX: [phone]   CC: Glenis Smoker, MD [place] [address] Phone: [phone]  Fax: [phone]    Return to Endocrinology clinic as below: Future Appointments  Date Time Provider [place]   06/19/2022  7:30 AM Deneise Getty, Melanie Crazier, MD [id number] None

## 2022-06-19 ENCOUNTER — Ambulatory Visit (INDEPENDENT_AMBULATORY_CARE_PROVIDER_SITE_OTHER): Payer: Medicare Other | Admitting: Internal Medicine

## 2022-06-19 ENCOUNTER — Encounter: Payer: Self-pay | Admitting: Internal Medicine

## 2022-06-19 LAB — BASIC METABOLIC PANEL
BUN: 10 mg/dL (ref 6–23)
CO2: 30 mEq/L (ref 19–32)
Calcium: 11.5 mg/dL — ABNORMAL HIGH (ref 8.4–10.5)
Chloride: 99 mEq/L (ref 96–112)
Creatinine, Ser: 0.62 mg/dL (ref 0.40–1.20)
GFR: 87.33 mL/min (ref >60.00)
Glucose, Bld: 84 mg/dL (ref 70–99)
Potassium: 4.8 mEq/L (ref 3.5–5.1)
Sodium: 136 mEq/L (ref 135–145)

## 2022-06-19 LAB — ALBUMIN: Albumin: 4.5 g/dL (ref 3.5–5.2)

## 2022-06-19 LAB — VITAMIN D 25 HYDROXY (VIT D DEFICIENCY, FRACTURES): VITD: 27.19 ng/mL — ABNORMAL LOW (ref 30.00–100.00)

## 2022-06-19 NOTE — Patient Instructions (Signed)
Stay hydrated  Avoid over the counter calcium tablets  Consume 2- servings of dietary calcium daily

## 2022-06-20 LAB — CALCIUM, IONIZED: Calcium, Ion: 5.8 mg/dL — ABNORMAL HIGH (ref 4.7–5.5)

## 2022-06-20 LAB — PARATHYROID HORMONE, INTACT (NO CA): PTH: 56 pg/mL (ref 16–77)

## 2022-06-21 DIAGNOSIS — G5782 Other specified mononeuropathies of left lower limb: Secondary | ICD-10-CM | POA: Diagnosis not present

## 2022-06-21 DIAGNOSIS — M2012 Hallux valgus (acquired), left foot: Secondary | ICD-10-CM | POA: Diagnosis not present

## 2022-06-29 DIAGNOSIS — E119 Type 2 diabetes mellitus without complications: Secondary | ICD-10-CM | POA: Diagnosis not present

## 2022-06-29 DIAGNOSIS — H524 Presbyopia: Secondary | ICD-10-CM | POA: Diagnosis not present

## 2022-07-04 ENCOUNTER — Other Ambulatory Visit: Payer: Self-pay | Admitting: Family Medicine

## 2022-07-04 DIAGNOSIS — Z1231 Encounter for screening mammogram for malignant neoplasm of breast: Secondary | ICD-10-CM

## 2022-07-08 DIAGNOSIS — B370 Candidal stomatitis: Secondary | ICD-10-CM | POA: Diagnosis not present

## 2022-07-08 DIAGNOSIS — R059 Cough, unspecified: Secondary | ICD-10-CM | POA: Diagnosis not present

## 2022-07-08 DIAGNOSIS — Z03818 Encounter for observation for suspected exposure to other biological agents ruled out: Secondary | ICD-10-CM | POA: Diagnosis not present

## 2022-07-08 DIAGNOSIS — B349 Viral infection, unspecified: Secondary | ICD-10-CM | POA: Diagnosis not present

## 2022-07-08 DIAGNOSIS — J029 Acute pharyngitis, unspecified: Secondary | ICD-10-CM | POA: Diagnosis not present

## 2022-07-24 DIAGNOSIS — R682 Dry mouth, unspecified: Secondary | ICD-10-CM | POA: Diagnosis not present

## 2022-07-24 DIAGNOSIS — H04123 Dry eye syndrome of bilateral lacrimal glands: Secondary | ICD-10-CM | POA: Diagnosis not present

## 2022-07-24 DIAGNOSIS — E210 Primary hyperparathyroidism: Secondary | ICD-10-CM | POA: Diagnosis not present

## 2022-07-28 DIAGNOSIS — B370 Candidal stomatitis: Secondary | ICD-10-CM | POA: Diagnosis not present

## 2022-07-28 DIAGNOSIS — R682 Dry mouth, unspecified: Secondary | ICD-10-CM | POA: Diagnosis not present

## 2022-07-30 ENCOUNTER — Ambulatory Visit
Admission: RE | Admit: 2022-07-30 | Discharge: 2022-07-30 | Disposition: A | Discharge status: Home / Self Care | Payer: Medicare Other | Source: Ambulatory Visit | Attending: Family Medicine | Admitting: Family Medicine

## 2022-07-30 DIAGNOSIS — Z1231 Encounter for screening mammogram for malignant neoplasm of breast: Secondary | ICD-10-CM

## 2022-08-06 DIAGNOSIS — I10 Essential (primary) hypertension: Secondary | ICD-10-CM | POA: Diagnosis not present

## 2022-08-06 DIAGNOSIS — E119 Type 2 diabetes mellitus without complications: Secondary | ICD-10-CM | POA: Diagnosis not present

## 2022-08-06 DIAGNOSIS — K1379 Other lesions of oral mucosa: Secondary | ICD-10-CM | POA: Diagnosis not present

## 2022-08-06 DIAGNOSIS — K148 Other diseases of tongue: Secondary | ICD-10-CM | POA: Diagnosis not present

## 2022-08-09 DIAGNOSIS — K130 Diseases of lips: Secondary | ICD-10-CM | POA: Diagnosis not present

## 2022-08-09 DIAGNOSIS — B370 Candidal stomatitis: Secondary | ICD-10-CM | POA: Diagnosis not present

## 2022-08-11 ENCOUNTER — Other Ambulatory Visit: Payer: Self-pay | Admitting: Cardiovascular Disease

## 2022-08-20 ENCOUNTER — Encounter: Payer: Self-pay | Admitting: Internal Medicine

## 2022-08-20 DIAGNOSIS — K148 Other diseases of tongue: Secondary | ICD-10-CM | POA: Diagnosis not present

## 2022-08-20 DIAGNOSIS — B370 Candidal stomatitis: Secondary | ICD-10-CM | POA: Diagnosis not present

## 2022-09-04 DIAGNOSIS — R21 Rash and other nonspecific skin eruption: Secondary | ICD-10-CM | POA: Diagnosis not present

## 2022-09-04 DIAGNOSIS — K1230 Oral mucositis (ulcerative), unspecified: Secondary | ICD-10-CM | POA: Diagnosis not present

## 2022-09-04 DIAGNOSIS — B370 Candidal stomatitis: Secondary | ICD-10-CM | POA: Diagnosis not present

## 2022-09-14 DIAGNOSIS — Z23 Encounter for immunization: Secondary | ICD-10-CM | POA: Diagnosis not present

## 2022-09-26 DIAGNOSIS — L720 Epidermal cyst: Secondary | ICD-10-CM | POA: Diagnosis not present

## 2022-11-02 DIAGNOSIS — Z0000 Encounter for general adult medical examination without abnormal findings: Secondary | ICD-10-CM | POA: Diagnosis not present

## 2022-11-02 DIAGNOSIS — E119 Type 2 diabetes mellitus without complications: Secondary | ICD-10-CM | POA: Diagnosis not present

## 2022-11-02 DIAGNOSIS — E7800 Pure hypercholesterolemia, unspecified: Secondary | ICD-10-CM | POA: Diagnosis not present

## 2022-11-02 DIAGNOSIS — I10 Essential (primary) hypertension: Secondary | ICD-10-CM | POA: Diagnosis not present

## 2022-11-05 NOTE — Progress Notes (Signed)
Name: Melissa Raymond  MRN/ DOB: [id number], 01-24-1946    Age/ Sex: 77 y.o., female    PCP: Glenis Smoker, MD   Reason for Endocrinology Evaluation: Hypercalcemia     Date of Initial Endocrinology Evaluation: 06/19/2022    HPI: Ms. Melissa Raymond is a 77 y.o. female with a past medical history of DM, HTN, dyslipidemia, osteoporosis. The patient presented for initial endocrinology clinic visit on 06/19/2022 for consultative assistance with her hypercalcemia  Ms. Perillo indicates that she was first diagnosed with hypercalcemia in January 2023, with a corrected serum calcium of 10.57 mg/DL, slightly low vitamin D at 25 NG/mL, no PTH available   She denies  history of kidney stones, kidney disease, liver disease, granulomatous disease. She does have osteopenia , has a hx of  prior fractures at age 37 but not as an adult.    DXA 03/2021 low bone density t scope -1.9 at [facility]   She denies  family history of osteoporosis, or parathyroid disease,sister with  thyroid disease.     SUBJECTIVE:    Today (11/05/22):  Melissa Raymond is here for a follow up on hypercalcemia. She is accompanied by her spouse Shanon Brow    She has stopped all OTC calcium  She is staying hydrated  Denies polydipsia  Denies recent renal stones  No recent falls or bone fractures  No cramps or spams in the hand Denies constipation or diarrhea    Vitamin D 1000 iu daily      HISTORY:  Past Medical History:  Past Medical History:  Diagnosis Date   Arthritis    hands   Breast mass, right    DM (diabetes mellitus) ([place])    HTN (hypertension)    Hyperlipidemia    Past Surgical History:  Past Surgical History:  Procedure Laterality Date   ABDOMINAL HYSTERECTOMY     BREAST BIOPSY Right    negative   BREAST LUMPECTOMY WITH RADIOACTIVE SEED LOCALIZATION Right 08/07/2018   Procedure: RIGHT BREAST LUMPECTOMY WITH RADIOACTIVE SEED LOCALIZATION;  Surgeon: Erroll Luna, MD;  Location: [facility];  Service: General;  Laterality: Right;   FLEXIBLE SIGMOIDOSCOPY N/A 12/30/2016   Procedure: FLEXIBLE SIGMOIDOSCOPY;  Surgeon: Teena Irani, MD;  Location: [facility];  Service: Endoscopy;  Laterality: N/A;    Social History:  reports that she has never smoked. She has never used smokeless tobacco. She reports current alcohol use. She reports that she does not use drugs. Family History: family history includes Breast cancer in her maternal aunt.   HOME MEDICATIONS: Allergies as of 11/06/2022       Reactions   Penicillins Swelling, Rash   Has patient had a PCN reaction causing immediate rash, facial/tongue/throat swelling, SOB or lightheadedness with hypotension: Yes Has patient had a PCN reaction causing severe rash involving mucus membranes or skin necrosis: No Has patient had a PCN reaction that required hospitalization No Has patient had a PCN reaction occurring within the last 10 years: No If all of the above answers are "NO", then may proceed with Cephalosporin use.        Medication List        Accurate as of November 05, 2022 11:55 AM. If you have any questions, ask your nurse or doctor.          aspirin EC 81 MG tablet Take 1 tablet (81 mg total) by mouth daily.   estradiol 0.1 MG/GM vaginal cream Commonly known as: ESTRACE Place 1 Applicatorful vaginally 2 (two)  times a week.   hydrocortisone cream 1 % Apply 1 application topically 2 (two) times daily as needed (skin rash).   losartan 100 MG tablet Commonly known as: COZAAR TAKE 1 TABLET(100 MG) BY MOUTH DAILY   metFORMIN 500 MG 24 hr tablet Commonly known as: GLUCOPHAGE-XR Take 500 mg by mouth every evening. With evening meal   rosuvastatin 10 MG tablet Commonly known as: CRESTOR TAKE 1 TABLET(10 MG) BY MOUTH DAILY          REVIEW OF SYSTEMS: A comprehensive ROS was conducted with the patient and is negative except as per HPI    OBJECTIVE:  VS: BP 122/76 (BP Location: Left Arm,  Patient Position: Sitting, Cuff Size: Small)   Pulse 74   Ht '5\' 4"'$  (1.626 m)   Wt 125 lb 12.8 oz (57.1 kg)   SpO2 98%   BMI 21.59 kg/m     Wt Readings from Last 3 Encounters:  06/19/22 132 lb 12.8 oz (60.2 kg)  02/16/22 130 lb 9.6 oz (59.2 kg)  01/31/21 134 lb 6.4 oz (61 kg)     EXAM: General: Pt appears well and is in NAD  Neck: General: Supple without adenopathy. Thyroid: Thyroid size normal.  No goiter or nodules appreciated.   Lungs: Clear with good BS bilat with no rales, rhonchi, or wheezes  Heart: Auscultation: RRR.  Abdomen: Normoactive bowel sounds, soft, nontender, without masses or organomegaly palpable  Extremities:  BL LE: No pretibial edema normal ROM and strength.  Mental Status: Judgment, insight: Intact Orientation: Oriented to time, place, and person Mood and affect: No depression, anxiety, or agitation     DATA REVIEWED:    Latest Reference Range & Units 11/06/22 08:21  Sodium 135 - 145 mEq/L 137  Potassium 3.5 - 5.1 mEq/L 4.2  Chloride 96 - 112 mEq/L 101  CO2 19 - 32 mEq/L 30  Glucose 70 - 99 mg/dL 112 (H)  BUN 6 - 23 mg/dL 6  Creatinine 0.40 - 1.20 mg/dL 0.69  Calcium 8.4 - 10.5 mg/dL 11.1 (H)  Albumin 3.5 - 5.2 g/dL 4.4  GFR >60.00 mL/min 84.88    Latest Reference Range & Units 11/06/22 08:21  VITD 30.00 - 100.00 ng/mL 35.98    ASSESSMENT/PLAN/RECOMMENDATIONS:   Primary hyperparathyroidism  -Patient is asymptomatic -We discussed pathophysiology of primary hyperparathyroidism, we also discussed indications for sx and all questions regarding this was answered  - DXA was done 03/2021 showing low bone density with a T score of -1.9 at the right femoral neck -Repeat serum calcium has decreased to 11.1 mg/DL (corrected 10.78)  -We will proceed with 24-hour urinary calcium excretion  Recommendations  Stay hydrated  Avoid over the counter calcium tablets  Consume 2- servings of dietary calcium daily  Vitamin D 1000 iu daily      Follow-up in 6 months    Signed electronically by: Mack Guise, MD  [facility] Endocrinology  [facility] [address] [place], [place] [address] Phone: [phone] FAX: [phone]   CC: Glenis Smoker, MD [place] [address] Phone: [phone] Fax: [phone]   Return to Endocrinology clinic as below: Future Appointments  Date Time Provider [place]  11/06/2022  7:50 AM Americus Perkey, Melanie Crazier, MD [facility] None

## 2022-11-06 ENCOUNTER — Ambulatory Visit: Payer: Medicare Other | Admitting: Internal Medicine

## 2022-11-06 ENCOUNTER — Encounter: Payer: Self-pay | Admitting: Internal Medicine

## 2022-11-06 LAB — ALBUMIN: Albumin: 4.4 g/dL (ref 3.5–5.2)

## 2022-11-06 LAB — BASIC METABOLIC PANEL
BUN: 6 mg/dL (ref 6–23)
CO2: 30 mEq/L (ref 19–32)
Calcium: 11.1 mg/dL — ABNORMAL HIGH (ref 8.4–10.5)
Chloride: 101 mEq/L (ref 96–112)
Creatinine, Ser: 0.69 mg/dL (ref 0.40–1.20)
GFR: 84.88 mL/min (ref >60.00)
Glucose, Bld: 112 mg/dL — ABNORMAL HIGH (ref 70–99)
Potassium: 4.2 mEq/L (ref 3.5–5.1)
Sodium: 137 mEq/L (ref 135–145)

## 2022-11-06 LAB — VITAMIN D 25 HYDROXY (VIT D DEFICIENCY, FRACTURES): VITD: 35.98 ng/mL (ref 30.00–100.00)

## 2022-11-06 NOTE — Patient Instructions (Addendum)
Stay hydrated  Avoid over the counter calcium tablets  Consume 2- servings of dietary calcium daily   24-Hour Urine Collection  You will be collecting your urine for a 24-hour period of time. Your timer starts with your first urine of the morning (For example - If you first pee at [place], your timer will start at [place]) [place] away your first urine of the morning Collect your urine every time you pee for the next 24 hours STOP your urine collection 24 hours after you started the collection (For example - You would stop at 9AM the day after you started)

## 2022-11-07 LAB — PARATHYROID HORMONE, INTACT (NO CA): PTH: 50 pg/mL (ref 16–77)

## 2022-11-12 ENCOUNTER — Other Ambulatory Visit: Payer: Medicare Other

## 2022-11-13 LAB — CALCIUM, URINE, 24 HOUR: Calcium, 24H Urine: 321 mg/24 h — ABNORMAL HIGH

## 2022-11-13 LAB — CREATININE, URINE, 24 HOUR: Creatinine, 24H Ur: 0.87 g/(24.h) (ref 0.50–2.15)

## 2022-11-17 ENCOUNTER — Encounter: Payer: Self-pay | Admitting: Internal Medicine

## 2022-11-19 ENCOUNTER — Other Ambulatory Visit: Payer: Self-pay | Admitting: Internal Medicine

## 2022-11-19 DIAGNOSIS — E210 Primary hyperparathyroidism: Secondary | ICD-10-CM

## 2022-11-23 DIAGNOSIS — R051 Acute cough: Secondary | ICD-10-CM | POA: Diagnosis not present

## 2022-11-26 ENCOUNTER — Other Ambulatory Visit: Payer: Self-pay

## 2022-11-26 MED ORDER — ROSUVASTATIN CALCIUM 10 MG PO TABS: ORAL_TABLET | ORAL | 0 refills | Status: DC

## 2022-11-28 DIAGNOSIS — J189 Pneumonia, unspecified organism: Secondary | ICD-10-CM | POA: Diagnosis not present

## 2022-12-05 DIAGNOSIS — K145 Plicated tongue: Secondary | ICD-10-CM | POA: Diagnosis not present

## 2022-12-05 DIAGNOSIS — B370 Candidal stomatitis: Secondary | ICD-10-CM | POA: Diagnosis not present

## 2022-12-05 DIAGNOSIS — K1230 Oral mucositis (ulcerative), unspecified: Secondary | ICD-10-CM | POA: Diagnosis not present

## 2022-12-12 DIAGNOSIS — I10 Essential (primary) hypertension: Secondary | ICD-10-CM | POA: Diagnosis not present

## 2022-12-12 DIAGNOSIS — E119 Type 2 diabetes mellitus without complications: Secondary | ICD-10-CM | POA: Diagnosis not present

## 2022-12-12 DIAGNOSIS — K080 Exfoliation of teeth due to systemic causes: Secondary | ICD-10-CM | POA: Diagnosis not present

## 2022-12-12 DIAGNOSIS — E7800 Pure hypercholesterolemia, unspecified: Secondary | ICD-10-CM | POA: Diagnosis not present

## 2022-12-13 DIAGNOSIS — E210 Primary hyperparathyroidism: Secondary | ICD-10-CM | POA: Diagnosis not present

## 2022-12-18 ENCOUNTER — Other Ambulatory Visit (HOSPITAL_COMMUNITY): Payer: Self-pay | Admitting: Surgery

## 2022-12-18 DIAGNOSIS — E210 Primary hyperparathyroidism: Secondary | ICD-10-CM

## 2022-12-27 ENCOUNTER — Ambulatory Visit (HOSPITAL_COMMUNITY)
Admission: RE | Admit: 2022-12-27 | Discharge: 2022-12-27 | Disposition: A | Discharge status: Home / Self Care | Payer: Medicare Other | Source: Ambulatory Visit | Attending: Surgery | Admitting: Surgery

## 2022-12-27 DIAGNOSIS — E210 Primary hyperparathyroidism: Secondary | ICD-10-CM | POA: Insufficient documentation

## 2022-12-27 DIAGNOSIS — E213 Hyperparathyroidism, unspecified: Secondary | ICD-10-CM | POA: Diagnosis not present

## 2022-12-28 NOTE — Progress Notes (Signed)
USN is normal.  Await nuclear med sestamibi scan.  tmg  Armandina Gemma, MD [facility] Surgery A [place] practice Office: [phone]

## 2022-12-31 ENCOUNTER — Other Ambulatory Visit (HOSPITAL_COMMUNITY): Payer: Self-pay | Admitting: Surgery

## 2022-12-31 DIAGNOSIS — E210 Primary hyperparathyroidism: Secondary | ICD-10-CM

## 2023-01-15 ENCOUNTER — Encounter (HOSPITAL_COMMUNITY)
Admission: RE | Admit: 2023-01-15 | Discharge: 2023-01-15 | Disposition: A | Discharge status: Home / Self Care | Payer: Medicare Other | Source: Ambulatory Visit | Attending: Surgery | Admitting: Surgery

## 2023-01-15 DIAGNOSIS — E210 Primary hyperparathyroidism: Secondary | ICD-10-CM | POA: Diagnosis not present

## 2023-01-15 MED ORDER — TECHNETIUM TC 99M SESTAMIBI - CARDIOLITE
23.3000 | Freq: Once | INTRAVENOUS | Status: AC | PRN
  Administered 2023-01-15: 23.3 via INTRAVENOUS

## 2023-01-17 NOTE — Progress Notes (Signed)
USN shows no evidence of enlarged parathyroid glands.  Sestamibi with subtle retention at multiple sites, not definitive for adenoma.  Claiborne Billings - please schedule patient for 4D-CT scan of neck with parathyroid protocol.  No need for patient to cancel any travel plans.  This is not unexpected nor an emergency.  We can schedule CT scan around any travel plans.  [place], MD [facility] Surgery A [place] practice Office: [phone]

## 2023-01-22 ENCOUNTER — Other Ambulatory Visit: Payer: Self-pay | Admitting: Surgery

## 2023-01-22 DIAGNOSIS — E210 Primary hyperparathyroidism: Secondary | ICD-10-CM

## 2023-02-07 ENCOUNTER — Other Ambulatory Visit: Payer: Self-pay | Admitting: Cardiovascular Disease

## 2023-02-14 ENCOUNTER — Ambulatory Visit
Admission: RE | Admit: 2023-02-14 | Discharge: 2023-02-14 | Disposition: A | Discharge status: Home / Self Care | Payer: Medicare Other | Source: Ambulatory Visit | Attending: Surgery | Admitting: Surgery

## 2023-02-14 DIAGNOSIS — E210 Primary hyperparathyroidism: Secondary | ICD-10-CM

## 2023-02-14 MED ORDER — IOPAMIDOL (ISOVUE-300) INJECTION 61%
75.0000 mL | Freq: Once | INTRAVENOUS | Status: AC | PRN
  Administered 2023-02-14: 75 mL via INTRAVENOUS

## 2023-02-20 ENCOUNTER — Ambulatory Visit: Payer: Self-pay | Admitting: Surgery

## 2023-02-20 DIAGNOSIS — B370 Candidal stomatitis: Secondary | ICD-10-CM | POA: Diagnosis not present

## 2023-02-20 DIAGNOSIS — K145 Plicated tongue: Secondary | ICD-10-CM | POA: Diagnosis not present

## 2023-02-20 DIAGNOSIS — K1230 Oral mucositis (ulcerative), unspecified: Secondary | ICD-10-CM | POA: Diagnosis not present

## 2023-02-20 NOTE — Progress Notes (Signed)
4D-CT scan demonstrates a small parathyroid adenoma on the left side adjacent to the esophagus.  Will plan to proceed with minimally invasive outpatient surgery in the near future at a time convenient for the patient.  Schedulers will contact patient to arrange.  I'll enter orders today.  Darnell Level, MD [facility] Surgery [facility] practice Office: [phone]

## 2023-02-26 ENCOUNTER — Telehealth: Payer: Self-pay | Admitting: Surgery

## 2023-02-26 NOTE — Telephone Encounter (Signed)
     Telephone call to patient to answer questions about upcoming surgery.  Will be an outpatient procedure.  Plan left minimally invasive parathyroidectomy.  Patient expressed understanding and appreciation.  OR scheduled for 5/30.  Darnell Level, MD [facility] practice Office: [phone]

## 2023-03-09 NOTE — Patient Instructions (Signed)
SURGICAL WAITING ROOM VISITATION Patients having surgery or a procedure may have no more than 2 support people in the waiting area - these visitors may rotate in the visitor waiting room.   Due to an increase in RSV and influenza rates and associated hospitalizations, children ages 75 and under may not visit patients in [facility] hospitals. If the patient needs to stay at the hospital during part of their recovery, the visitor guidelines for inpatient rooms apply.  PRE-OP VISITATION  Pre-op nurse will coordinate an appropriate time for 1 support person to accompany the patient in pre-op.  This support person may not rotate.  This visitor will be contacted when the time is appropriate for the visitor to come back in the pre-op area.  Please refer to the [facility] website for the visitor guidelines for Inpatients (after your surgery is over and you are in a regular room).  You are not required to quarantine at this time prior to your surgery. However, you must do this: Hand Hygiene often Do NOT share personal items Notify your provider if you are in close contact with someone who has COVID or you develop fever 100.4 or greater, new onset of sneezing, cough, sore throat, shortness of breath or body aches.  If you test positive for Covid or have been in contact with anyone that has tested positive in the last 10 days please notify you surgeon.    Your procedure is scheduled on:  Thursday  Mar 21, 2023  Report to [facility] Main Entrance: [facility] entrance where the [employer] is available.   Report to admitting at:   08:15  AM  +++++Call this number if you have any questions or problems the morning of surgery [phone]  Do not eat food after Midnight the night prior to your surgery/procedure.  After Midnight you may have the following liquids until   07:30 AM DAY OF SURGERY  Clear Liquid Diet Water Black Coffee (sugar ok, NO MILK/CREAM OR CREAMERS)  Tea (sugar ok, NO  MILK/CREAM OR CREAMERS) regular and decaf                             Plain Jell-O  with no fruit (NO RED)                                           Fruit ices (not with fruit pulp, NO RED)                                     Popsicles (NO RED)                                                                  Juice: apple, WHITE grape, WHITE cranberry Sports drinks like Gatorade or Powerade (NO RED)               FOLLOW  ANY ADDITIONAL PRE OP INSTRUCTIONS YOU RECEIVED FROM YOUR SURGEON'S OFFICE!!!   Oral Hygiene is also important to reduce your risk of infection.  Remember - BRUSH YOUR TEETH THE MORNING OF SURGERY WITH YOUR REGULAR TOOTHPASTE  Do NOT smoke after Midnight the night before surgery.  Take ONLY these medicines the morning of surgery with A SIP OF WATER: none ? Tacrolimus swish, Diflucan                   You may not have any metal on your body including hair pins, jewelry, and body piercing  Do not wear make-up, lotions, powders, perfumes or deodorant  Do not wear nail polish including gel and S&S, artificial / acrylic nails, or any other type of covering on natural nails including finger and toenails. If you have artificial nails, gel coating, etc., that needs to be removed by a nail salon, Please have this removed prior to surgery. Not doing so may mean that your surgery could be cancelled or delayed if the Surgeon or anesthesia staff feels like they are unable to monitor you safely.   Do not shave 48 hours prior to surgery to avoid nicks in your skin which may contribute to postoperative infections.   Contacts, Hearing Aids, dentures or bridgework may not be worn into surgery. DENTURES WILL BE REMOVED PRIOR TO SURGERY PLEASE DO NOT APPLY "Poly grip" OR ADHESIVES!!!  Patients discharged on the day of surgery will not be allowed to drive home.  Someone NEEDS to stay with you for the first 24 hours after anesthesia.  Do not bring your home medications to the hospital.  The Pharmacy will dispense medications listed on your medication list to you during your admission in the Hospital.  Special Instructions: Bring a copy of your healthcare power of attorney and living will documents the day of surgery, if you wish to have them scanned into your [facility] Records- EPIC  Please read over the following fact sheets you were given: IF YOU HAVE QUESTIONS ABOUT YOUR PRE-OP INSTRUCTIONS, PLEASE CALL [phone].    - Preparing for Surgery Before surgery, you can play an important role.  Because skin is not sterile, your skin needs to be as free of germs as possible.  You can reduce the number of germs on your skin by washing with CHG (chlorahexidine gluconate) soap before surgery.  CHG is an antiseptic cleaner which kills germs and bonds with the skin to continue killing germs even after washing. Please DO NOT use if you have an allergy to CHG or antibacterial soaps.  If your skin becomes reddened/irritated stop using the CHG and inform your nurse when you arrive at Short Stay. Do not shave (including legs and underarms) for at least 48 hours prior to the first CHG shower.  You may shave your face/neck.  Please follow these instructions carefully:  1.  Shower with CHG Soap the night before surgery and the  morning of surgery.  2.  If you choose to wash your hair, wash your hair first as usual with your normal  shampoo.  3.  After you shampoo, rinse your hair and body thoroughly to remove the shampoo.                             4.  Use CHG as you would any other liquid soap.  You can apply chg directly to the skin and wash.  Gently with a scrungie or clean washcloth.  5.  Apply the CHG Soap to your body ONLY FROM THE NECK DOWN.   Do not use on  face/ open                           Wound or open sores. Avoid contact with eyes, ears mouth and genitals (private parts).                       Wash face,  Genitals (private parts) with your normal soap.              6.  Wash thoroughly, paying special attention to the area where your  surgery  will be performed.  7.  Thoroughly rinse your body with warm water from the neck down.  8.  DO NOT shower/wash with your normal soap after using and rinsing off the CHG Soap.            9.  Pat yourself dry with a clean towel.            10.  Wear clean pajamas.            11.  Place clean sheets on your bed the night of your first shower and do not  sleep with pets.  ON THE DAY OF SURGERY : Do not apply any lotions/deodorants the morning of surgery.  Please wear clean clothes to the hospital/surgery center.    FAILURE TO FOLLOW THESE INSTRUCTIONS MAY RESULT IN THE CANCELLATION OF YOUR SURGERY  PATIENT SIGNATURE_________________________________  NURSE SIGNATURE__________________________________  ________________________________________________________________________

## 2023-03-09 NOTE — Progress Notes (Signed)
COVID Vaccine received:  []  No [x]  Yes Date of any COVID positive Test in last 90 days:  PCP -Garth Bigness, MD  Cardiologist -Kristeen Miss, MD  Endocrinology- Terrace Arabia, MD  Chest x-ray - n/a EKG - 02-16-2022-(Anterior fascicular block, poor R wave progression likely d/t lead placement) Will repeat at PST Stress Test -n/a  ECHO - n/a Cardiac Cath - n/a CTA Cors Morphology- score of 8  on 04-01-2018 in Epic  PCR screen: []  Ordered & Completed           []   No Order but Needs PROFEND           [x]   N/A for this surgery  Surgery Plan:  [x]  Ambulatory                            []  Outpatient in bed                            []  Admit  Anesthesia:    [x]  General  []  Spinal                           []   Choice []   MAC  Pacemaker / ICD device [x]  No []  Yes   Spinal Cord Stimulator:[x]  No []  Yes       History of Sleep Apnea? [x]  No []  Yes   CPAP used?- [x]  No []  Yes    Does the patient monitor blood sugar?          []  No []  Yes  []  N/A  Patient has: []  NO Hx DM   [x]  Pre-DM                 []  DM1  []   DM2 Last A1c: 2018 ?6.6 Does patient have a [employer] or Dexacom? []  No []  Yes   Fasting Blood Sugar Ranges-  Checks Blood Sugar _____ times a day  Other Diabetic medications/ instructions: Metformin 500mg  w/ supper. Hold DOS  Blood Thinner / Instructions: none Aspirin Instructions: ASA 81 mg   Hold x 1 week  ERAS Protocol Ordered: []  No  [x]  Yes PRE-SURGERY []  ENSURE  []  G2  [x]  No Drink Ordered Patient is to be NPO after: 0730  Comments:   Activity level: Patient is able / unable to climb a flight of stairs without difficulty; []  No CP  []  No SOB, but would have ___   Patient can / can not perform ADLs without assistance.   Anesthesia review: Pre-DM, HTN, s/p right breast lumpectomy w/ radioactive seeds implanted. Anemia, CAD- mild nonobstructive per CTA Morphology in 2019  Patient denies shortness of breath, fever, cough and chest pain at PAT  appointment.  Patient verbalized understanding and agreement to the Pre-Surgical Instructions that were given to them at this PAT appointment. Patient was also educated of the need to review these PAT instructions again prior to her surgery.I reviewed the appropriate phone numbers to call if they have any and questions or concerns.

## 2023-03-11 ENCOUNTER — Other Ambulatory Visit: Payer: Self-pay

## 2023-03-11 ENCOUNTER — Encounter (HOSPITAL_COMMUNITY)
Admission: RE | Admit: 2023-03-11 | Discharge: 2023-03-11 | Disposition: A | Discharge status: Home / Self Care | Payer: Medicare Other | Source: Ambulatory Visit | Attending: Surgery | Admitting: Surgery

## 2023-03-11 ENCOUNTER — Encounter (HOSPITAL_COMMUNITY): Payer: Self-pay

## 2023-03-11 VITALS — BP 150/70 | HR 69 | Temp 98.6°F | Resp 14 | Ht 64.0 in | Wt 119.0 lb

## 2023-03-11 DIAGNOSIS — R7303 Prediabetes: Secondary | ICD-10-CM | POA: Insufficient documentation

## 2023-03-11 DIAGNOSIS — I10 Essential (primary) hypertension: Secondary | ICD-10-CM

## 2023-03-11 DIAGNOSIS — Z01818 Encounter for other preprocedural examination: Secondary | ICD-10-CM | POA: Diagnosis not present

## 2023-03-11 HISTORY — DX: Anxiety disorder, unspecified: F41.9

## 2023-03-11 HISTORY — DX: Myoneural disorder, unspecified: G70.9

## 2023-03-11 HISTORY — DX: Prediabetes: R73.03

## 2023-03-11 LAB — BASIC METABOLIC PANEL
Anion gap: 10 (ref 5–15)
BUN: 11 mg/dL (ref 8–23)
CO2: 27 mmol/L (ref 22–32)
Calcium: 11 mg/dL — ABNORMAL HIGH (ref 8.9–10.3)
Chloride: 102 mmol/L (ref 98–111)
Creatinine, Ser: 0.55 mg/dL (ref 0.44–1.00)
GFR, Estimated: >60 mL/min (ref >60)
Glucose, Bld: 101 mg/dL — ABNORMAL HIGH (ref 70–99)
Potassium: 5.9 mmol/L — ABNORMAL HIGH (ref 3.5–5.1)
Sodium: 139 mmol/L (ref 135–145)

## 2023-03-11 LAB — CBC
HCT: 42.2 % (ref 36.0–46.0)
Hemoglobin: 13.5 g/dL (ref 12.0–15.0)
MCH: 30.1 pg (ref 26.0–34.0)
MCHC: 32 g/dL (ref 30.0–36.0)
MCV: 94 fL (ref 80.0–100.0)
Platelets: 231 10*3/uL (ref 150–400)
RBC: 4.49 MIL/uL (ref 3.87–5.11)
RDW: 13.4 % (ref 11.5–15.5)
WBC: 8.1 10*3/uL (ref 4.0–10.5)
nRBC: 0 % (ref 0.0–0.2)

## 2023-03-11 LAB — GLUCOSE, CAPILLARY: Glucose-Capillary: 94 mg/dL (ref 70–99)

## 2023-03-11 LAB — HEMOGLOBIN A1C
Hgb A1c MFr Bld: 5.8 % — ABNORMAL HIGH (ref 4.8–5.6)
Mean Plasma Glucose: 119.76 mg/dL

## 2023-03-12 ENCOUNTER — Encounter: Payer: Self-pay | Admitting: Cardiovascular Disease

## 2023-03-21 ENCOUNTER — Encounter (HOSPITAL_COMMUNITY): Payer: Self-pay | Admitting: Surgery

## 2023-03-21 ENCOUNTER — Ambulatory Visit (HOSPITAL_COMMUNITY): Payer: Medicare Other | Admitting: Physician Assistant

## 2023-03-21 ENCOUNTER — Encounter (HOSPITAL_COMMUNITY)
Admission: RE | Disposition: A | Discharge status: Home / Self Care | Payer: Self-pay | Source: Home / Self Care | Attending: Surgery

## 2023-03-21 ENCOUNTER — Ambulatory Visit (HOSPITAL_BASED_OUTPATIENT_CLINIC_OR_DEPARTMENT_OTHER): Payer: Medicare Other | Admitting: Certified Registered Nurse Anesthetist

## 2023-03-21 ENCOUNTER — Ambulatory Visit (HOSPITAL_COMMUNITY)
Admission: RE | Admit: 2023-03-21 | Discharge: 2023-03-21 | Disposition: A | Discharge status: Home / Self Care | Payer: Medicare Other | Attending: Surgery | Admitting: Surgery

## 2023-03-21 ENCOUNTER — Other Ambulatory Visit: Payer: Self-pay

## 2023-03-21 DIAGNOSIS — Z7984 Long term (current) use of oral hypoglycemic drugs: Secondary | ICD-10-CM | POA: Diagnosis not present

## 2023-03-21 DIAGNOSIS — Z79899 Other long term (current) drug therapy: Secondary | ICD-10-CM | POA: Diagnosis not present

## 2023-03-21 DIAGNOSIS — E210 Primary hyperparathyroidism: Secondary | ICD-10-CM | POA: Diagnosis not present

## 2023-03-21 DIAGNOSIS — R7303 Prediabetes: Secondary | ICD-10-CM

## 2023-03-21 DIAGNOSIS — I10 Essential (primary) hypertension: Secondary | ICD-10-CM

## 2023-03-21 DIAGNOSIS — E119 Type 2 diabetes mellitus without complications: Secondary | ICD-10-CM | POA: Diagnosis not present

## 2023-03-21 DIAGNOSIS — E214 Other specified disorders of parathyroid gland: Secondary | ICD-10-CM | POA: Diagnosis not present

## 2023-03-21 HISTORY — PX: PARATHYROIDECTOMY: SHX19

## 2023-03-21 LAB — BASIC METABOLIC PANEL
Anion gap: 8 (ref 5–15)
BUN: 10 mg/dL (ref 8–23)
CO2: 25 mmol/L (ref 22–32)
Calcium: 10.2 mg/dL (ref 8.9–10.3)
Chloride: 103 mmol/L (ref 98–111)
Creatinine, Ser: 0.63 mg/dL (ref 0.44–1.00)
GFR, Estimated: >60 mL/min (ref >60)
Glucose, Bld: 98 mg/dL (ref 70–99)
Potassium: 4.1 mmol/L (ref 3.5–5.1)
Sodium: 136 mmol/L (ref 135–145)

## 2023-03-21 LAB — GLUCOSE, CAPILLARY
Glucose-Capillary: 103 mg/dL — ABNORMAL HIGH (ref 70–99)
Glucose-Capillary: 100 mg/dL — ABNORMAL HIGH (ref 70–99)

## 2023-03-21 SURGERY — PARATHYROIDECTOMY
Anesthesia: General | Laterality: Left

## 2023-03-21 MED ORDER — OXYCODONE HCL 5 MG PO TABS
ORAL_TABLET | ORAL | Status: AC
  Filled 2023-03-21: qty 1

## 2023-03-21 MED ORDER — ONDANSETRON HCL 4 MG/2ML IJ SOLN
INTRAMUSCULAR | Status: DC | PRN
  Administered 2023-03-21: 4 mg via INTRAVENOUS

## 2023-03-21 MED ORDER — SUCCINYLCHOLINE CHLORIDE 200 MG/10ML IV SOSY
PREFILLED_SYRINGE | INTRAVENOUS | Status: AC
  Filled 2023-03-21: qty 10

## 2023-03-21 MED ORDER — FENTANYL CITRATE (PF) 100 MCG/2ML IJ SOLN
INTRAMUSCULAR | Status: DC | PRN
  Administered 2023-03-21: 100 ug via INTRAVENOUS

## 2023-03-21 MED ORDER — CHLORHEXIDINE GLUCONATE CLOTH 2 % EX PADS: 6.0000 | MEDICATED_PAD | Freq: Once | CUTANEOUS | Status: DC

## 2023-03-21 MED ORDER — FENTANYL CITRATE PF 50 MCG/ML IJ SOSY
PREFILLED_SYRINGE | INTRAMUSCULAR | Status: AC
  Administered 2023-03-21: 25 ug via INTRAVENOUS
  Filled 2023-03-21: qty 1

## 2023-03-21 MED ORDER — ROCURONIUM BROMIDE 10 MG/ML (PF) SYRINGE
PREFILLED_SYRINGE | INTRAVENOUS | Status: DC | PRN
  Administered 2023-03-21: 50 mg via INTRAVENOUS

## 2023-03-21 MED ORDER — OXYCODONE HCL 5 MG PO TABS
5.0000 mg | ORAL_TABLET | Freq: Once | ORAL | Status: AC | PRN
  Administered 2023-03-21: 5 mg via ORAL

## 2023-03-21 MED ORDER — BUPIVACAINE HCL (PF) 0.25 % IJ SOLN
INTRAMUSCULAR | Status: AC
  Filled 2023-03-21: qty 30

## 2023-03-21 MED ORDER — LIDOCAINE HCL (PF) 2 % IJ SOLN
INTRAMUSCULAR | Status: AC
  Filled 2023-03-21: qty 5

## 2023-03-21 MED ORDER — CHLORHEXIDINE GLUCONATE 0.12 % MT SOLN
15.0000 mL | Freq: Once | OROMUCOSAL | Status: AC
  Administered 2023-03-21: 15 mL via OROMUCOSAL

## 2023-03-21 MED ORDER — FENTANYL CITRATE PF 50 MCG/ML IJ SOSY
25.0000 ug | PREFILLED_SYRINGE | INTRAMUSCULAR | Status: DC | PRN
  Administered 2023-03-21: 25 ug via INTRAVENOUS

## 2023-03-21 MED ORDER — 0.9 % SODIUM CHLORIDE (POUR BTL) OPTIME
TOPICAL | Status: DC | PRN
  Administered 2023-03-21: 1000 mL

## 2023-03-21 MED ORDER — FENTANYL CITRATE (PF) 100 MCG/2ML IJ SOLN
INTRAMUSCULAR | Status: AC
  Filled 2023-03-21: qty 2

## 2023-03-21 MED ORDER — HEMOSTATIC AGENTS (NO CHARGE) OPTIME
TOPICAL | Status: DC | PRN
  Administered 2023-03-21: 1 via TOPICAL

## 2023-03-21 MED ORDER — ACETAMINOPHEN 500 MG PO TABS
1000.0000 mg | ORAL_TABLET | Freq: Once | ORAL | Status: AC
  Administered 2023-03-21: 1000 mg via ORAL
  Filled 2023-03-21: qty 2

## 2023-03-21 MED ORDER — OXYCODONE HCL 5 MG/5ML PO SOLN: 5.0000 mg | Freq: Once | ORAL | Status: AC | PRN

## 2023-03-21 MED ORDER — LACTATED RINGERS IV SOLN: INTRAVENOUS | Status: DC

## 2023-03-21 MED ORDER — ORAL CARE MOUTH RINSE: 15.0000 mL | Freq: Once | OROMUCOSAL | Status: AC

## 2023-03-21 MED ORDER — PROPOFOL 10 MG/ML IV BOLUS
INTRAVENOUS | Status: DC | PRN
  Administered 2023-03-21: 150 mg via INTRAVENOUS

## 2023-03-21 MED ORDER — CIPROFLOXACIN IN D5W 400 MG/200ML IV SOLN
400.0000 mg | INTRAVENOUS | Status: AC
  Administered 2023-03-21: 400 mg via INTRAVENOUS
  Filled 2023-03-21: qty 200

## 2023-03-21 MED ORDER — ONDANSETRON HCL 4 MG/2ML IJ SOLN: 4.0000 mg | Freq: Once | INTRAMUSCULAR | Status: DC | PRN

## 2023-03-21 MED ORDER — TRAMADOL HCL 50 MG PO TABS: 50.0000 mg | ORAL_TABLET | Freq: Four times a day (QID) | ORAL | 0 refills | Status: DC | PRN

## 2023-03-21 MED ORDER — SUGAMMADEX SODIUM 200 MG/2ML IV SOLN
INTRAVENOUS | Status: DC | PRN
  Administered 2023-03-21: 200 mg via INTRAVENOUS

## 2023-03-21 MED ORDER — PROPOFOL 10 MG/ML IV BOLUS
INTRAVENOUS | Status: AC
  Filled 2023-03-21: qty 20

## 2023-03-21 MED ORDER — LIDOCAINE 2% (20 MG/ML) 5 ML SYRINGE
INTRAMUSCULAR | Status: DC | PRN
  Administered 2023-03-21: 60 mg via INTRAVENOUS

## 2023-03-21 MED ORDER — ROCURONIUM BROMIDE 10 MG/ML (PF) SYRINGE
PREFILLED_SYRINGE | INTRAVENOUS | Status: AC
  Filled 2023-03-21: qty 10

## 2023-03-21 MED ORDER — ONDANSETRON HCL 4 MG/2ML IJ SOLN
INTRAMUSCULAR | Status: AC
  Filled 2023-03-21: qty 2

## 2023-03-21 MED ORDER — BUPIVACAINE HCL 0.25 % IJ SOLN
INTRAMUSCULAR | Status: DC | PRN
  Administered 2023-03-21: 5 mL

## 2023-03-21 MED ORDER — DEXAMETHASONE SODIUM PHOSPHATE 4 MG/ML IJ SOLN
INTRAMUSCULAR | Status: DC | PRN
  Administered 2023-03-21: 5 mg via INTRAVENOUS

## 2023-03-21 MED ORDER — DEXAMETHASONE SODIUM PHOSPHATE 4 MG/ML IJ SOLN: INTRAMUSCULAR | Status: DC | PRN

## 2023-03-21 SURGICAL SUPPLY — 35 items

## 2023-03-21 NOTE — Anesthesia Procedure Notes (Signed)
Procedure Name: Intubation Date/Time: 03/21/2023 10:04 AM  Performed by: Vanessa Lakem[place], CRNAPre-anesthesia Checklist: Patient identified, Emergency Drugs available, Suction available and Patient being monitored Patient Re-evaluated:Patient Re-evaluated prior to induction Oxygen Delivery Method: Circle system utilized Preoxygenation: Pre-oxygenation with 100% oxygen Induction Type: IV induction Ventilation: Mask ventilation without difficulty Laryngoscope Size: 2 and Miller Grade View: Grade I Tube type: Oral Tube size: 7.0 mm Number of attempts: 1 Airway Equipment and Method: Stylet Placement Confirmation: ETT inserted through vocal cords under direct vision, positive ETCO2 and breath sounds checked- equal and bilateral Secured at: 21 cm Tube secured with: Tape Dental Injury: Teeth and Oropharynx as per pre-operative assessment

## 2023-03-21 NOTE — Anesthesia Preprocedure Evaluation (Addendum)
Anesthesia Evaluation  Patient identified by MRN, date of birth, ID band Patient awake    Reviewed: Allergy & Precautions, NPO status , Patient's Chart, lab work & pertinent test results  History of Anesthesia Complications Negative for: history of anesthetic complications  Airway Mallampati: II  TM Distance: >3 FB Neck ROM: Full    Dental  (+) Dental Advisory Given   Pulmonary   Possible undiagnosed OSA    Pulmonary exam normal        Cardiovascular hypertension, Pt. on medications Normal cardiovascular exam     Neuro/Psych  PSYCHIATRIC DISORDERS Anxiety     negative neurological ROS     GI/Hepatic negative GI ROS, Neg liver ROS,,,  Endo/Other  diabetes, Type 2, Oral Hypoglycemic Agents   Primary hyperparathyroidism   Renal/GU negative Renal ROS     Musculoskeletal  (+) Arthritis ,    Abdominal   Peds  Hematology negative hematology ROS (+)   Anesthesia Other Findings   Reproductive/Obstetrics                             Anesthesia Physical Anesthesia Plan  ASA: 2  Anesthesia Plan: General   Post-op Pain Management: Tylenol PO (pre-op)*   Induction: Intravenous  PONV Risk Score and Plan: 3 and Treatment may vary due to age or medical condition, Ondansetron and Dexamethasone  Airway Management Planned: Oral ETT  Additional Equipment: None  Intra-op Plan:   Post-operative Plan: Extubation in OR  Informed Consent: I have reviewed the patients History and Physical, chart, labs and discussed the procedure including the risks, benefits and alternatives for the proposed anesthesia with the patient or authorized representative who has indicated his/her understanding and acceptance.     Dental advisory given  Plan Discussed with: CRNA and Anesthesiologist  Anesthesia Plan Comments:        Anesthesia Quick Evaluation

## 2023-03-21 NOTE — Interval H&P Note (Signed)
History and Physical Interval Note:  03/21/2023 8:18 AM  Melissa Raymond  has presented today for surgery, with the diagnosis of PRIMARY HYPERPARATHYROIDISM.  The various methods of treatment have been discussed with the patient and family. After consideration of risks, benefits and other options for treatment, the patient has consented to    Procedure(s): LEFT PARATHYROIDECTOMY (Left) as a surgical intervention.    The patient's history has been reviewed, patient examined, no change in status, stable for surgery.  I have reviewed the patient's chart and labs.  Questions were answered to the patient's satisfaction.    Darnell Level, MD [facility] Surgery [facility] practice Office: [phone]   Darnell Level

## 2023-03-21 NOTE — H&P (Signed)
REFERRING PHYSICIAN: Shamleffer, Clemmie Krill  PROVIDER: Cathy Ropp Myra Rude, MD   Chief Complaint: New Consultation (Primary hyperparathyroidism)  History of Present Illness:  Patient is referred by Dr. Lesly Dukes for surgical evaluation and management of suspected primary hyperparathyroidism. Patient was noted approximately 1 year ago by her primary care physician to have hypercalcemia. Laboratory studies recently have demonstrated an elevated calcium level of 11.1 and an unsuppressed intact PTH level of 50. 24-hour urine collection for calcium was elevated at 321. 25-hydroxy vitamin D level was normal at 36. Patient has had no significant complications other than a bone density scan demonstrating osteopenia. She may have mild fatigue. She has had no fractures. She denies nephrolithiasis. There is no family history of parathyroid disease. Patient presents today accompanied by her husband to discuss further evaluation and management.  Review of Systems: A complete review of systems was obtained from the patient. I have reviewed this information and discussed as appropriate with the patient. See HPI as well for other ROS.  Review of Systems Constitutional: Positive for malaise/fatigue. HENT: Negative. Eyes: Negative. Respiratory: Negative. Cardiovascular: Negative. Gastrointestinal: Negative. Genitourinary: Negative. Musculoskeletal: Negative. Skin: Negative. Neurological: Negative. Endo/Heme/Allergies: Negative. Psychiatric/Behavioral: Negative.   Medical History: Past Medical History: Diagnosis Date Arthritis Diabetes mellitus without complication (CMS-HCC) Hyperlipidemia Hypertension  Patient Active Problem List Diagnosis Primary hyperparathyroidism (CMS-HCC)  Past Surgical History: Procedure Laterality Date FLEXIBLE SIGMOIDOSCOPY 12/30/2016 Breast lumpectomy with radioactive seed localization Right 08/07/2018 Dr  Luisa Hart HYSTERECTOMY   Allergies Allergen Reactions Penicillins Rash and Swelling Has patient had a PCN reaction causing immediate rash, facial/tongue/throat swelling, SOB or lightheadedness with hypotension: Yes Has patient had a PCN reaction causing severe rash involving mucus membranes or skin necrosis: No Has patient had a PCN reaction that required hospitalization No Has patient had a PCN reaction occurring within the last 10 years: No If all of the above answers are "NO", then may proceed with Cephalosporin use.  Current Outpatient Medications on File Prior to Visit Medication Sig Dispense Refill estradioL (ESTRACE) 0.01 % (0.1 mg/gram) vaginal cream Place vaginally fluconazole (DIFLUCAN) 150 MG tablet Take 2 tablets by mouth weekly (on Thursdays) for 1 month, then 1 tablet by mouth weekly (on Thursdays) for 1 month. losartan (COZAAR) 100 MG tablet Take 100 mg by mouth once daily metFORMIN (GLUCOPHAGE-XR) 500 MG XR tablet 1 tablet (500 mg total). rosuvastatin (CRESTOR) 10 MG tablet Take 1 tablet by mouth once daily aspirin 81 MG EC tablet Take 81 mg by mouth once daily multivitamin tablet Take 1 tablet by mouth once daily  No current facility-administered medications on file prior to visit.  Family History Problem Relation Age of Onset High blood pressure (Hypertension) Mother Hyperlipidemia (Elevated cholesterol) Mother Diabetes Mother Coronary Artery Disease (Blocked arteries around heart) Father Hyperlipidemia (Elevated cholesterol) Sister High blood pressure (Hypertension) Brother Breast cancer Maternal Aunt   Social History  Tobacco Use Smoking Status Never Smokeless Tobacco Never   Social History  Socioeconomic History Marital status: Married Tobacco Use Smoking status: Never Smokeless tobacco: Never Vaping Use Vaping Use: Never used Substance and Sexual Activity Alcohol use: Yes Drug use: Yes Comment: CANNABIS  Objective:  Vitals: BP:  130/80 Pulse: 73 Temp: 36.9 C (98.4 F) SpO2: 97% Weight: 56.3 kg (124 lb 3.2 oz) Height: 163.8 cm (5' 4.5") PainSc: 0-No pain  Body mass index is 20.99 kg/m.  Physical Exam  GENERAL APPEARANCE Comfortable, no acute issues Development: normal Gross deformities: none  SKIN Rash, lesions, ulcers: none Induration, erythema: none  Nodules: none palpable  EYES Conjunctiva and lids: normal Pupils: equal and reactive  EARS, NOSE, MOUTH, THROAT External ears: no lesion or deformity External nose: no lesion or deformity Hearing: grossly normal  NECK Symmetric: yes Trachea: midline Thyroid: no palpable nodules in the thyroid bed  CHEST Respiratory effort: normal Retraction or accessory muscle use: no Breath sounds: normal bilaterally Rales, rhonchi, wheeze: none  CARDIOVASCULAR Auscultation: regular rhythm, normal rate Murmurs: none Pulses: radial pulse 2+ palpable Lower extremity edema: none  ABDOMEN Not assessed  GENITOURINARY/RECTAL Not assessed  MUSCULOSKELETAL Station and gait: normal Digits and nails: no clubbing or cyanosis Muscle strength: grossly normal all extremities Range of motion: grossly normal all extremities Deformity: none  LYMPHATIC Cervical: none palpable Supraclavicular: none palpable  PSYCHIATRIC Oriented to person, place, and time: yes Mood and affect: normal for situation Judgment and insight: appropriate for situation   Assessment and Plan:  Primary hyperparathyroidism (CMS-HCC)  Patient is referred by her endocrinologist for surgical evaluation and recommendations regarding suspected primary hyperparathyroidism.  Patient provided with a copy of "Parathyroid Surgery: Treatment for Your Parathyroid Gland Problem", published by [employer], 12 pages. Book reviewed and explained to patient during visit today.  Today we reviewed her clinical history. We reviewed her recent laboratory studies. I have recommended proceeding with  imaging studies as she does have biochemical evidence of primary hyperparathyroidism. We will arrange for a nuclear medicine parathyroid scan with sestamibi and an ultrasound examination of the neck. The studies have approximately an 80% chance of confirming the diagnosis and localizing the parathyroid adenoma. If the studies failed to identify the adenoma, then we will proceed with a 4D CT scan of the neck.  If we can successfully localize the adenoma, then the patient will be an excellent candidate for minimally invasive outpatient surgery. Today we discussed that procedure. We discussed the size and location of the surgical incision. We discussed the hospital stay to be anticipated and her postoperative recovery. The patient understands and agrees to proceed with further diagnostic studies.  Patient will undergo the above studies. We will contact her when the results are available for review.  Darnell Level, MD [facility] practice Office: [phone]

## 2023-03-21 NOTE — Transfer of Care (Signed)
Immediate Anesthesia Transfer of Care Note  Patient: Melissa Raymond  Procedure(s) Performed: LEFT PARATHYROIDECTOMY (Left)  Patient Location: PACU  Anesthesia Type:General  Level of Consciousness: awake and patient cooperative  Airway & Oxygen Therapy: Patient Spontanous Breathing and Patient connected to face mask  Post-op Assessment: Report given to RN and Post -op Vital signs reviewed and stable  Post vital signs: Reviewed and stable  Last Vitals:  Vitals Value Taken Time  BP 145/76 03/21/23 1102  Temp 36.4 C 03/21/23 1102  Pulse 70 03/21/23 1103  Resp    SpO2 99 % 03/21/23 1103  Vitals shown include unvalidated device data.  Last Pain:  Vitals:   03/21/23 0842  TempSrc: Oral         Complications: No notable events documented.

## 2023-03-21 NOTE — Op Note (Signed)
OPERATIVE REPORT - PARATHYROIDECTOMY  Preoperative diagnosis: Primary hyperparathyroidism  Postop diagnosis: Same  Procedure: Left superior minimally invasive parathyroidectomy  Surgeon:  Darnell Level, MD  Anesthesia: General endotracheal  Estimated blood loss: Minimal  Preparation: ChloraPrep  Indications: Patient is referred by Dr. Lesly Dukes for surgical evaluation and management of suspected primary hyperparathyroidism. Patient was noted approximately 1 year ago by her primary care physician to have hypercalcemia. Laboratory studies recently have demonstrated an elevated calcium level of 11.1 and an unsuppressed intact PTH level of 50. 24-hour urine collection for calcium was elevated at 321. 25-hydroxy vitamin D level was normal at 36. Patient has had no significant complications other than a bone density scan demonstrating osteopenia. CT scan localized a left adenoma.  Patient now comes to surgery for parathyroidectomy.  Procedure: The patient was prepared in the pre-operative holding area. The patient was brought to the operating room and placed in a supine position on the operating room table. Following administration of general anesthesia, the patient was positioned and then prepped and draped in the usual strict aseptic fashion. After ascertaining that an adequate level of anesthesia been achieved, a neck incision was made with a #15 blade. Dissection was carried through subcutaneous tissues and platysma. Hemostasis was obtained with the electrocautery. Skin flaps were developed circumferentially and a Weitlander retractor was placed for exposure.  Strap muscles were incised in the midline. Strap muscles were reflected laterally exposing the thyroid lobe. With gentle blunt dissection the thyroid lobe was mobilized.  Dissection was carried posteriorly and an enlarged parathyroid gland was identified just above and posterior to the inferior thyroid artery. It was gently mobilized.  Vascular structures were divided between small ligaclips. Care was taken to avoid the recurrent laryngeal nerve. The parathyroid gland was completely excised. It was submitted to pathology where frozen section confirmed hypercellular parathyroid tissue consistent with adenoma.  Neck was irrigated with warm saline and good hemostasis was noted. Fibrillar was placed in the operative field. Strap muscles were approximated in the midline with interrupted 3-0 Vicryl sutures. Platysma was closed with interrupted 3-0 Vicryl sutures. Marcaine was infiltrated circumferentially. Skin was closed with a running 4-0 Monocryl subcuticular suture. Wound was washed and dried and Dermabond was applied. Patient was awakened from anesthesia and brought to the recovery room. The patient tolerated the procedure well.   Darnell Level, MD [facility] Office: [phone]

## 2023-03-21 NOTE — Discharge Instructions (Signed)
[facility] - Dr. Athelene Hursey  THYROID & PARATHYROID SURGERY:  POST-OP INSTRUCTIONS  Always review the instruction sheet provided by the hospital nurse at discharge.  A prescription for pain medication may be sent to your pharmacy at the time of discharge.  Take your pain medication as prescribed.  If narcotic pain medicine is not needed, then you may take acetaminophen (Tylenol) or ibuprofen (Advil) as needed for pain or soreness.  Take your normal home medications as prescribed unless otherwise directed.  If you need a refill on your pain medication, please contact the office during regular business hours.  Prescriptions will not be processed by the office after 5:00PM or on weekends.  Start with a light diet upon arrival home, such as soup and crackers or toast.  Be sure to drink plenty of fluids.  Resume your normal diet the day after surgery.  Most patients will experience some swelling and bruising on the chest and neck area.  Ice packs will help for the first 48 hours after arriving home.  Swelling and bruising will take several days to resolve.   It is common to experience some constipation after surgery.  Increasing fluid intake and taking a stool softener (Colace) will usually help to prevent this problem.  A mild laxative (Milk of Magnesia or Miralax) should be taken according to package directions if there has been no bowel movement after 48 hours.  Dermabond glue covers your incision. This seals the wound and you may shower at any time. The Dermabond will remain in place for about a week.  You may gradually remove the glue when it loosens around the edges.  If you need to loosen the Dermabond for removal, apply a layer of Vaseline to the wound for 15 minutes and then remove with a Kleenex. Your sutures are under the skin and will not show - they will dissolve on their own.  You may resume light daily activities beginning the day after discharge (such as self-care,  walking, climbing stairs), gradually increasing activities as tolerated. You may have sexual intercourse when it is comfortable. Refrain from any heavy lifting or straining until approved by your doctor. You may drive when you no longer are taking prescription pain medication, you can comfortably wear a seatbelt, and you can safely maneuver your car and apply the brakes.  You will see your doctor in the office for a follow-up appointment approximately three weeks after your surgery.  Make sure that you call for this appointment within a day or two after you arrive home to insure a convenient appointment time. Please have any requested laboratory tests performed a few days prior to your office visit so that the results will be available at your follow up appointment.  WHEN TO CALL THE [facility] OFFICE: -- Fever greater than 101.5 -- Inability to urinate -- Nausea and/or vomiting - persistent -- Extreme swelling or bruising -- Continued bleeding from incision -- Increased pain, redness, or drainage from the incision -- Difficulty swallowing or breathing -- Muscle cramping or spasms -- Numbness or tingling in hands or around lips  The clinic staff is available to answer your questions during regular business hours.  Please don't hesitate to call and ask to speak to one of the nurses if you have concerns.  [facility] OFFICE: [phone] (24 hours)  Please sign up for MyChart accounts. This will allow you to communicate directly with my nurse or myself without having to call the office. It will also allow you   to view your test results. You will need to enroll in MyChart for my office ([facility]) and for the hospital ([facility]).  Melissa Arscott, MD [facility] practice 

## 2023-03-22 ENCOUNTER — Encounter (HOSPITAL_COMMUNITY): Payer: Self-pay | Admitting: Surgery

## 2023-03-22 LAB — SURGICAL PATHOLOGY

## 2023-03-22 NOTE — Progress Notes (Signed)
Pathology as expected.  tmg  Suanne Minahan, MD [facility] practice Office: [phone] 

## 2023-03-22 NOTE — Anesthesia Postprocedure Evaluation (Signed)
Anesthesia Post Note  Patient: Melissa Raymond  Procedure(s) Performed: LEFT PARATHYROIDECTOMY (Left)     Patient location during evaluation: PACU Anesthesia Type: General Level of consciousness: awake and alert Pain management: pain level controlled Vital Signs Assessment: post-procedure vital signs reviewed and stable Respiratory status: spontaneous breathing, nonlabored ventilation and respiratory function stable Cardiovascular status: stable and blood pressure returned to baseline Anesthetic complications: no   No notable events documented.  Last Vitals:  Vitals:   03/21/23 1214 03/21/23 1225  BP: (!) 157/74 (!) 157/74  Pulse: 62 71  Resp: 15 17  Temp: 36.4 C 36.4 C  SpO2: 96% 95%    Last Pain:  Vitals:   03/21/23 1140  TempSrc:   PainSc: 3                  Beryle Lathe

## 2023-04-09 ENCOUNTER — Encounter: Payer: Self-pay | Admitting: Cardiovascular Disease

## 2023-04-11 DIAGNOSIS — Z9089 Acquired absence of other organs: Secondary | ICD-10-CM | POA: Diagnosis not present

## 2023-04-11 DIAGNOSIS — Z9889 Other specified postprocedural states: Secondary | ICD-10-CM | POA: Diagnosis not present

## 2023-04-11 DIAGNOSIS — E210 Primary hyperparathyroidism: Secondary | ICD-10-CM | POA: Diagnosis not present

## 2023-04-26 ENCOUNTER — Encounter: Payer: Self-pay | Admitting: Internal Medicine

## 2023-04-30 DIAGNOSIS — K080 Exfoliation of teeth due to systemic causes: Secondary | ICD-10-CM | POA: Diagnosis not present

## 2023-05-01 ENCOUNTER — Other Ambulatory Visit: Payer: Self-pay | Admitting: *Deleted

## 2023-05-01 ENCOUNTER — Other Ambulatory Visit: Payer: Self-pay | Admitting: Cardiovascular Disease

## 2023-05-01 MED ORDER — ROSUVASTATIN CALCIUM 10 MG PO TABS: ORAL_TABLET | ORAL | 0 refills | Status: DC

## 2023-05-04 ENCOUNTER — Other Ambulatory Visit: Payer: Self-pay | Admitting: Cardiovascular Disease

## 2023-05-07 DIAGNOSIS — K080 Exfoliation of teeth due to systemic causes: Secondary | ICD-10-CM | POA: Diagnosis not present

## 2023-05-08 ENCOUNTER — Other Ambulatory Visit: Payer: Self-pay

## 2023-05-08 MED ORDER — ROSUVASTATIN CALCIUM 10 MG PO TABS: ORAL_TABLET | ORAL | 1 refills | Status: DC

## 2023-05-15 ENCOUNTER — Ambulatory Visit: Payer: Medicare Other | Admitting: Internal Medicine

## 2023-05-27 DIAGNOSIS — L603 Nail dystrophy: Secondary | ICD-10-CM | POA: Diagnosis not present

## 2023-05-29 ENCOUNTER — Ambulatory Visit: Payer: Medicare Other | Admitting: Internal Medicine

## 2023-05-29 DIAGNOSIS — K080 Exfoliation of teeth due to systemic causes: Secondary | ICD-10-CM | POA: Diagnosis not present

## 2023-06-04 DIAGNOSIS — F3341 Major depressive disorder, recurrent, in partial remission: Secondary | ICD-10-CM | POA: Diagnosis not present

## 2023-06-04 DIAGNOSIS — E119 Type 2 diabetes mellitus without complications: Secondary | ICD-10-CM | POA: Diagnosis not present

## 2023-06-04 DIAGNOSIS — E210 Primary hyperparathyroidism: Secondary | ICD-10-CM | POA: Diagnosis not present

## 2023-06-04 DIAGNOSIS — I700 Atherosclerosis of aorta: Secondary | ICD-10-CM | POA: Diagnosis not present

## 2023-06-05 DIAGNOSIS — B370 Candidal stomatitis: Secondary | ICD-10-CM | POA: Diagnosis not present

## 2023-06-05 DIAGNOSIS — K1230 Oral mucositis (ulcerative), unspecified: Secondary | ICD-10-CM | POA: Diagnosis not present

## 2023-06-13 ENCOUNTER — Encounter: Payer: Self-pay | Admitting: Cardiovascular Disease

## 2023-06-13 NOTE — Progress Notes (Signed)
Darletta Tyndale Date of Birth  1946/09/22       [facility] Office      [address]     [place], [place]  [address]     [phone]      Fax  [phone]         Previous patient of Dr. Daleen Squibb  Problem List: 1. Hyperlipidemia 2.  Diabetes Mellitus 3. Borderline HTN     Deb is a 77 yo with hx of hyperlipidemia.   Strong family hx of CVA.   She is healthy.   She does elliptical and walks regularly.  Does yoga regularly.   Has lost 8 lbs in the past 2-3 weeks because her cholesterol level was up.    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 30, 2015:  Reece Levy is a 77 yo who we see for hx of hyperlipidemia. Exercising regularly  Did have a back injury but i getting back into working out   Jan. 24, 2017:  Reece Levy is seen today for follow up of her hyperlipidemia.  Getting some exercise.  Tolerating the Atorvastatin well.  Under lots of stress.   Jan. 24, 2018:  Under lots of stress.    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   ( [employer] )  [facility] [facility]  Sept. 10, 2018:  Reece Levy is seen today .   Under lots of stress recently  No CP or dyspnea.    BP has been mostly OK Does not eat salt .    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  She is seen for follow-up visit.  She has a history of hypertension and hypercholesterolemia. Has been walking some .   Nov. 4, 2021:  Reece Levy is seen today for follow up of her hyperlipidemia and diabetes mellitus. Tries to avid salt  Wt today is 130.8 ( down 16 lbs from last year )  Exercising regularly   January 31, 2021:  Deb seen today for follow-up  of her hyperlipidemia and diabetes mellitus.  Her weight today is 134 pounds. Was to seem my PA recently, missed the appt due to covid scare.  walks about every day .   February 16, 2022 Deb is seen for follow up of her HLD and DM No CP or dyspnea  Coronary CT angiogram from June, 2019 revealed a coronary calcium score of 8.  This was 0 percentile for age and sex matched controls.  She had mild nonobstructive coronary artery disease of the proximal LAD.   Aug. 23, 2024  Deb is seen for follow up of her HLD , DM Exercising regularly .  Is having some bruising  She is on ASA 81 mg a day for her mild CAD   Current Outpatient Medications on File Prior to Visit  Medication Sig Dispense Refill   aspirin EC 81 MG tablet Take 1 tablet (81 mg total) by mouth daily. 90 tablet 3   Cholecalciferol (VITAMIN D) 50 MCG (2000 UT) tablet Take 2,000 Units  by mouth daily.     diphenhydrAMINE HCl, Sleep, (ZZZQUIL) 25 MG CAPS Take 50 mg by mouth at bedtime as needed (sleep).     estradiol (ESTRACE) 0.1 MG/GM vaginal cream Place 1 Applicatorful vaginally 2 (two) times a week.     fluconazole (DIFLUCAN) 150 MG tablet Take 150 mg by mouth every Sunday.     losartan (COZAAR) 100 MG tablet TAKE 1 TABLET(100 MG) BY MOUTH DAILY 90 tablet 0   metFORMIN (GLUCOPHAGE-XR) 500 MG 24 hr tablet Take 500 mg by mouth daily with supper.  5   nystatin (MYCOSTATIN) 100000 UNIT/ML suspension Use as directed 5 mLs in the mouth or throat See admin instructions. Use it 5 mL to rinse out your mouth once a day, then spit it out     rosuvastatin (CRESTOR) 10 MG tablet TAKE 1 TABLET(10 MG) BY MOUTH DAILY. 30 tablet 1   tacrolimus (PROGRAF) 1 MG capsule Dissolve 1 cap in 1/2 liter of water//swish for 2 minutes & spit twice daily     traMADol (ULTRAM) 50 MG tablet Take 1 tablet (50 mg total) by mouth every 6 (six) hours as needed for moderate pain. 15 tablet 0   No current facility-administered medications on file prior to visit.     Allergies  Allergen Reactions   Nystatin     Other Reaction(s): swelling in mouth   Penicillins Swelling and Rash         Past Medical History:  Diagnosis Date   Anxiety    Arthritis    hands   Breast mass, right    HTN (hypertension)    Hyperlipidemia    Neuromuscular disorder (HCC)    Morton's neuroma Left foot   Pre-diabetes     Past Surgical History:  Procedure Laterality Date   ABDOMINAL HYSTERECTOMY     BREAST BIOPSY Right    negative   BREAST LUMPECTOMY WITH RADIOACTIVE SEED LOCALIZATION Right 08/07/2018   Procedure: RIGHT BREAST LUMPECTOMY WITH RADIOACTIVE SEED LOCALIZATION;  Surgeon: Harriette Bouillon, MD;  Location: [facility];  Service: General;  Laterality: Right;   COLONOSCOPY W/ BIOPSIES     FLEXIBLE SIGMOIDOSCOPY N/A 12/30/2016   Procedure: FLEXIBLE SIGMOIDOSCOPY;  Surgeon: Dorena Cookey, MD;  Location: [facility]COPY;  Service: Endoscopy;  Laterality: N/A;   PARATHYROIDECTOMY Left 03/21/2023   Procedure: LEFT PARATHYROIDECTOMY;  Surgeon: Darnell Level, MD;  Location: [facility];  Service: General;  Laterality: Left;    Social History   Tobacco Use  Smoking Status Never  Smokeless Tobacco Never    Social History   Substance and Sexual Activity  Alcohol Use Yes   Comment: social    Family History  Problem Relation Age of Onset   Breast cancer Maternal Aunt     Reviw of Systems:  Reviewed in the HPI.  All other systems are negative.      ECG:       Assessment / Plan:   1. Hyperlipidemia-lipids look good.  Continue current medications.    2. Hypertension:     Blood pressure looks good.  Continue current medications.   Kristeen Miss, MD  06/14/2023 11:48 AM    [facility] [address] [place],  [address] [place], [place]  [address] Pager [phone] Phone: [phone]; Fax: [phone]

## 2023-06-14 ENCOUNTER — Encounter: Payer: Self-pay | Admitting: Cardiovascular Disease

## 2023-06-14 ENCOUNTER — Ambulatory Visit: Payer: Medicare Other | Admitting: Cardiovascular Disease

## 2023-06-14 VITALS — BP 130/94 | HR 70 | Ht 64.0 in | Wt 125.0 lb

## 2023-06-14 DIAGNOSIS — E782 Mixed hyperlipidemia: Secondary | ICD-10-CM | POA: Diagnosis not present

## 2023-06-14 DIAGNOSIS — I10 Essential (primary) hypertension: Secondary | ICD-10-CM

## 2023-06-14 NOTE — Patient Instructions (Signed)
Medication Instructions:  Your physician recommends that you continue on your current medications as directed. Please refer to the Current Medication list given to you today.  *If you need a refill on your cardiac medications before your next appointment, please call your pharmacy*  Lab Work: None ordered today  Testing/Procedures: None ordered today  Follow-Up: At [facility], you and your health needs are our priority.  As part of our continuing mission to provide you with exceptional heart care, we have created designated Provider Care Teams.  These Care Teams include your primary Cardiologist (physician) and Advanced Practice Providers (APPs -  Physician Assistants and Nurse Practitioners) who all work together to provide you with the care you need, when you need it.  Your next appointment:   12 month(s)  The format for your next appointment:   In Person  Provider:   Philip Nahser, MD   

## 2023-06-18 ENCOUNTER — Other Ambulatory Visit: Payer: Self-pay | Admitting: *Deleted

## 2023-06-18 MED ORDER — ROSUVASTATIN CALCIUM 10 MG PO TABS: ORAL_TABLET | ORAL | 3 refills | Status: DC

## 2023-07-31 DIAGNOSIS — Z23 Encounter for immunization: Secondary | ICD-10-CM | POA: Diagnosis not present

## 2023-07-31 DIAGNOSIS — D692 Other nonthrombocytopenic purpura: Secondary | ICD-10-CM | POA: Diagnosis not present

## 2023-07-31 DIAGNOSIS — E210 Primary hyperparathyroidism: Secondary | ICD-10-CM | POA: Diagnosis not present

## 2023-07-31 DIAGNOSIS — E119 Type 2 diabetes mellitus without complications: Secondary | ICD-10-CM | POA: Diagnosis not present

## 2023-08-06 ENCOUNTER — Other Ambulatory Visit: Payer: Self-pay | Admitting: Cardiovascular Disease

## 2023-08-30 DIAGNOSIS — H524 Presbyopia: Secondary | ICD-10-CM | POA: Diagnosis not present

## 2023-09-18 ENCOUNTER — Other Ambulatory Visit: Payer: Self-pay | Admitting: Family Medicine

## 2023-09-18 ENCOUNTER — Ambulatory Visit
Admission: RE | Admit: 2023-09-18 | Discharge: 2023-09-18 | Disposition: A | Discharge status: Home / Self Care | Payer: Medicare Other | Source: Ambulatory Visit | Attending: Family Medicine | Admitting: Family Medicine

## 2023-09-18 DIAGNOSIS — Z1231 Encounter for screening mammogram for malignant neoplasm of breast: Secondary | ICD-10-CM | POA: Diagnosis not present

## 2023-11-04 DIAGNOSIS — K080 Exfoliation of teeth due to systemic causes: Secondary | ICD-10-CM | POA: Diagnosis not present

## 2023-11-15 DIAGNOSIS — E7800 Pure hypercholesterolemia, unspecified: Secondary | ICD-10-CM | POA: Diagnosis not present

## 2023-11-15 DIAGNOSIS — E210 Primary hyperparathyroidism: Secondary | ICD-10-CM | POA: Diagnosis not present

## 2023-11-15 DIAGNOSIS — M810 Age-related osteoporosis without current pathological fracture: Secondary | ICD-10-CM | POA: Diagnosis not present

## 2023-11-15 DIAGNOSIS — Z0000 Encounter for general adult medical examination without abnormal findings: Secondary | ICD-10-CM | POA: Diagnosis not present

## 2023-11-15 DIAGNOSIS — I10 Essential (primary) hypertension: Secondary | ICD-10-CM | POA: Diagnosis not present

## 2023-11-15 DIAGNOSIS — E119 Type 2 diabetes mellitus without complications: Secondary | ICD-10-CM | POA: Diagnosis not present

## 2023-11-19 ENCOUNTER — Other Ambulatory Visit: Payer: Self-pay | Admitting: Family Medicine

## 2023-11-19 DIAGNOSIS — M8589 Other specified disorders of bone density and structure, multiple sites: Secondary | ICD-10-CM

## 2023-12-10 DIAGNOSIS — K080 Exfoliation of teeth due to systemic causes: Secondary | ICD-10-CM | POA: Diagnosis not present

## 2024-03-11 DIAGNOSIS — H66002 Acute suppurative otitis media without spontaneous rupture of ear drum, left ear: Secondary | ICD-10-CM | POA: Diagnosis not present

## 2024-04-01 NOTE — Progress Notes (Signed)
 Ben Lacinda Curvin D.[facility] [address] [place] [address] Phone: [phone]   Assessment and Plan:     1. Acute pain of right shoulder (Primary) -Acute, initial visit - Right shoulder pain x 4 weeks most consistent with subacromial bursitis versus rotator cuff strain likely due to overhead physical activity - Start HEP for rotator cuff - Start meloxicam 15 mg daily x2 weeks.  If still having pain after 2 weeks, complete 3rd-week of NSAID. May use remaining NSAID as needed once daily for pain control.  Do not to use additional over-the-counter NSAIDs (ibuprofen , naproxen, Advil , Aleve, etc.) while taking prescription NSAIDs.  May use Tylenol  [phone] mg 2 to 3 times a day for breakthrough pain. - X-ray obtained in clinic.  My interpretation: No acute fracture or dislocation.  Mild degenerative changes at inferior glenoid and AC joint   Pertinent previous records reviewed include none  Follow Up: 4 weeks for reevaluation.  If no improvement or worsening of symptoms, could consider physical therapy versus subacromial CSI   Subjective:   I, Melissa Raymond, am serving as a Neurosurgeon for Doctor Ulysees Gander  Chief Complaint: right shoulder pain   HPI:   04/02/2024 Patient is a 78 year old female with right shoulder pain. Patient states that she was doing squats and lifting weights over her head when she squats down been going on for 4 weeks. States that the pain is front of shoulder radiates down her bicep no tingling or numbness. Laying on the right side or lifting hands/arms above her shoulder causes pain. Patient has tried OTC medication to help with the pain.   Also has issues with th right wrist states she has carpal tunnel    Relevant Historical Information: Hypertension  Additional pertinent review of systems negative.   Current Outpatient Medications:    aspirin  EC 81 MG tablet, Take 1 tablet (81 mg total) by mouth daily., Disp:  90 tablet, Rfl: 3   Cholecalciferol (VITAMIN D ) 50 MCG (2000 UT) tablet, Take 2,000 Units by mouth daily., Disp: , Rfl:    diphenhydrAMINE  HCl, Sleep, (ZZZQUIL) 25 MG CAPS, Take 50 mg by mouth at bedtime as needed (sleep)., Disp: , Rfl:    estradiol (ESTRACE) 0.1 MG/GM vaginal cream, Place 1 Applicatorful vaginally 2 (two) times a week., Disp: , Rfl:    fluconazole (DIFLUCAN) 150 MG tablet, Take 150 mg by mouth every Sunday., Disp: , Rfl:    losartan  (COZAAR ) 100 MG tablet, TAKE 1 TABLET(100 MG) BY MOUTH DAILY, Disp: 90 tablet, Rfl: 2   metFORMIN (GLUCOPHAGE-XR) 500 MG 24 hr tablet, Take 500 mg by mouth daily with supper., Disp: , Rfl: 5   nystatin (MYCOSTATIN) 100000 UNIT/ML suspension, Use as directed 5 mLs in the mouth or throat See admin instructions. Use it 5 mL to rinse out your mouth once a day, then spit it out, Disp: , Rfl:    rosuvastatin  (CRESTOR ) 10 MG tablet, TAKE 1 TABLET(10 MG) BY MOUTH DAILY., Disp: 90 tablet, Rfl: 3   Objective:     Vitals:   04/02/24 1300  BP: 120/78  Pulse: 91  SpO2: 98%  Weight: 128 lb (58.1 kg)  Height: 5' 4 (1.626 m)      Body mass index is 21.97 kg/m.    Physical Exam:    Gen: Appears well, nad, nontoxic and pleasant Neuro:sensation intact, strength is 5/5 with df/pf/inv/ev, muscle tone wnl Skin: no suspicious lesion or defmority Psych: A&O, appropriate mood and  affect  Right shoulder:  No deformity, swelling or muscle wasting No scapular winging FF 180 with mild discomfort at end range, abd 180 with mild discomfort at end range, int 10, ext 90 NTTP over the [place], clavicle, ac, coracoid, biceps groove, humerus, deltoid, trapezius, cervical spine Positive Hawkins, subscap liftoff, O'Brien Neg neer,  , empty can,   crossarm,   speeds Neg ant drawer, sulcus sign, apprehension Negative Spurling's test bilat FROM of neck    Electronically signed by:  Marshall Skeeter D.[facility] 1:20 PM 04/02/24

## 2024-04-02 ENCOUNTER — Ambulatory Visit: Admitting: Sports Medicine

## 2024-04-02 ENCOUNTER — Ambulatory Visit

## 2024-04-02 VITALS — BP 120/78 | HR 91 | Ht 64.0 in | Wt 128.0 lb

## 2024-04-02 DIAGNOSIS — M25511 Pain in right shoulder: Secondary | ICD-10-CM

## 2024-04-02 DIAGNOSIS — M19011 Primary osteoarthritis, right shoulder: Secondary | ICD-10-CM | POA: Diagnosis not present

## 2024-04-02 MED ORDER — MELOXICAM 15 MG PO TABS: 15.0000 mg | ORAL_TABLET | Freq: Every day | ORAL | 0 refills | Status: DC

## 2024-04-02 NOTE — Patient Instructions (Addendum)
 Good to see you  - Start meloxicam 15 mg daily x2 weeks.  If still having pain after 2 weeks, complete 3rd-week of NSAID. May use remaining NSAID as needed once daily for pain control.  Do not to use additional over-the-counter NSAIDs (ibuprofen , naproxen, Advil , Aleve, etc.) while taking prescription NSAIDs.  May use Tylenol  [phone] mg 2 to 3 times a day for breakthrough pain.   Rotator cuff exercises given  Follow up in 4 weeks

## 2024-04-06 ENCOUNTER — Ambulatory Visit: Payer: Self-pay | Admitting: Sports Medicine

## 2024-04-23 ENCOUNTER — Encounter: Payer: Self-pay | Admitting: Sports Medicine

## 2024-04-23 ENCOUNTER — Other Ambulatory Visit: Payer: Self-pay | Admitting: Cardiovascular Disease

## 2024-04-30 ENCOUNTER — Ambulatory Visit: Admitting: Sports Medicine

## 2024-05-12 DIAGNOSIS — K080 Exfoliation of teeth due to systemic causes: Secondary | ICD-10-CM | POA: Diagnosis not present

## 2024-05-13 DIAGNOSIS — K080 Exfoliation of teeth due to systemic causes: Secondary | ICD-10-CM | POA: Diagnosis not present

## 2024-05-20 DIAGNOSIS — F3341 Major depressive disorder, recurrent, in partial remission: Secondary | ICD-10-CM | POA: Diagnosis not present

## 2024-05-20 DIAGNOSIS — E7800 Pure hypercholesterolemia, unspecified: Secondary | ICD-10-CM | POA: Diagnosis not present

## 2024-05-20 DIAGNOSIS — I10 Essential (primary) hypertension: Secondary | ICD-10-CM | POA: Diagnosis not present

## 2024-05-20 DIAGNOSIS — E119 Type 2 diabetes mellitus without complications: Secondary | ICD-10-CM | POA: Diagnosis not present

## 2024-06-15 ENCOUNTER — Other Ambulatory Visit (HOSPITAL_COMMUNITY): Payer: Self-pay

## 2024-06-15 ENCOUNTER — Encounter: Payer: Self-pay | Admitting: Cardiology

## 2024-06-15 ENCOUNTER — Ambulatory Visit: Attending: Cardiology | Admitting: Cardiology

## 2024-06-15 VITALS — BP 160/84 | HR 72 | Ht 64.0 in | Wt 130.6 lb

## 2024-06-15 DIAGNOSIS — I10 Essential (primary) hypertension: Secondary | ICD-10-CM

## 2024-06-15 DIAGNOSIS — E119 Type 2 diabetes mellitus without complications: Secondary | ICD-10-CM

## 2024-06-15 DIAGNOSIS — I2510 Atherosclerotic heart disease of native coronary artery without angina pectoris: Secondary | ICD-10-CM | POA: Diagnosis not present

## 2024-06-15 DIAGNOSIS — E782 Mixed hyperlipidemia: Secondary | ICD-10-CM | POA: Diagnosis not present

## 2024-06-15 MED ORDER — HYDROCHLOROTHIAZIDE 25 MG PO TABS
12.5000 mg | ORAL_TABLET | Freq: Every day | ORAL | 3 refills | Status: AC
  Filled 2024-06-15: qty 45, 90d supply, fill #0

## 2024-06-15 NOTE — Patient Instructions (Signed)
 Medication Instructions:  Please start hydrochlorothiazide  25 mg - take 1/2 tablet daily. Continue all other medications as listed.  *If you need a refill on your cardiac medications before your next appointment, please call your pharmacy*  Follow-Up: At [facility], you and your health needs are our priority.  As part of our continuing mission to provide you with exceptional heart care, our providers are all part of one team.  This team includes your primary Cardiologist (physician) and Advanced Practice Providers or APPs (Physician Assistants and Nurse Practitioners) who all work together to provide you with the care you need, when you need it.  Your next appointment:   6 month(s)  Provider:   One of our Advanced Practice Providers (APPs): Morse Clause, PA-C  Lamarr Satterfield, NP Miriam Shams, NP  Olivia Pavy, PA-C Josefa Beauvais, NP  Leontine Salen, PA-C Orren Fabry, PA-C  [place], PA-C Ernest Dick, NP  Damien Braver, NP Jon Hails, PA-C  Waddell Donath, PA-C    Dayna Dunn, PA-C  Scott Weaver, PA-C Lum Louis, NP Katlyn West, NP Callie Goodrich, PA-C  Evan Williams, PA-C Sheng Haley, PA-C  Xika Zhao, NP Kathleen Johnson, PA-C       We recommend signing up for the patient portal called MyChart.  Sign up information is provided on this After Visit Summary.  MyChart is used to connect with patients for Virtual Visits (Telemedicine).  Patients are able to view lab/test results, encounter notes, upcoming appointments, etc.  Non-urgent messages can be sent to your provider as well.   To learn more about what you can do with MyChart, go to [email or web address].

## 2024-06-15 NOTE — Progress Notes (Signed)
 Cardiology Office Note:  .   Date:  06/15/2024  ID:  Melissa Raymond, DOB [birth year], MRN [id number] PCP: Chrystal Lamarr RAMAN, MD   [facility]Care Providers Cardiologist:  Oneil Parchment, MD     History of Present Illness: .   Melissa Raymond is a 78 y.o. female Discussed the use of AI scribe software for clinical note transcription with the patient, who gave verbal consent to proceed.  History of Present Illness Melissa Raymond is a 78 year old female with hypertension, diabetes, and coronary artery disease who presents with concerns about elevated blood pressure. She was previously under the care of Dr. Jetty.  She monitors her blood pressure at home, which typically ranges from 130 to 140 mmHg, occasionally reaching 150 mmHg, and sometimes as low as 128 mmHg. She checks her blood pressure in the morning and noted it was very high today. She describes herself as 'a very high strung person' and acknowledges that stress affects her blood pressure. She is terrified of strokes, as her mother had a stroke at 15 and died at 26.  She has a history of coronary artery disease with a coronary CT angiogram from June 2019 showing a coronary calcium  score of 8 and minimal/mild nonobstructive coronary disease of the proximal LAD. She is on aspirin  81 mg. Her last LDL was 94 in 2025, and her triglycerides were 122. She is also on rosuvastatin  10 mg.  She has diabetes with a hemoglobin A1c of 5.8. She is managing her diabetes well and is satisfied with her current A1c levels.  She self-medicates with alcohol to manage stress, acknowledging that it can raise blood pressure. She has considered alternatives like gummies but is unsure about their effects. She is trying to be honest about her alcohol use and its impact on her health.  She engages in physical activity, including daily walking and yoga, but questions if she needs to increase her cardio to get her heart rate up. No chest pain but notes  increased stress as she ages, which she knows is not good for her blood pressure or body.  Her current medications include losartan  100 mg for blood pressure, rosuvastatin  10 mg for cholesterol, and aspirin  81 mg. She reports bruising easily and bleeding when she bumps into things, which she attributes to aspirin  use.      Studies Reviewed: SABRA   EKG Interpretation Date/Time:  Monday June 15 2024 08:03:28 EDT Ventricular Rate:  72 PR Interval:  178 QRS Duration:  80 QT Interval:  364 QTC Calculation: 398 R Axis:   -52  Text Interpretation: Normal sinus rhythm Left anterior fascicular block Septal infarct (cited on or before 11-Mar-2023) When compared with ECG of 11-Mar-2023 09:31, No significant change was found Confirmed by Parchment Oneil ([id number]) on 06/15/2024 8:25:52 AM    Results LABS Creatinine: 0.63 LDL: 94 (2025) Triglycerides: 122 Hemoglobin: 13.3 Hemoglobin A1c: 6.0  RADIOLOGY Coronary CT angiogram: Coronary calcium  score of 8, minimal/mild nonobstructive coronary disease of the proximal LAD (03/31/2018)  PATHOLOGY Breast biopsy/lumpectomy: Radioactive seed localization (2019) Risk Assessment/Calculations:           Physical Exam:   VS:  BP (!) 160/84 (BP Location: Left Arm, Patient Position: Sitting)   Pulse 72   Ht 5' 4 (1.626 m)   Wt 130 lb 9.6 oz (59.2 kg)   SpO2 97%   BMI 22.42 kg/m    Wt Readings from Last 3 Encounters:  06/15/24 130 lb 9.6 oz (59.2 kg)  04/02/24 128  lb (58.1 kg)  06/14/23 125 lb (56.7 kg)    GEN: Well nourished, well developed in no acute distress NECK: No JVD; No carotid bruits CARDIAC: RRR, no murmurs, no rubs, no gallops RESPIRATORY:  Clear to auscultation without rales, wheezing or rhonchi  ABDOMEN: Soft, non-tender, non-distended EXTREMITIES:  No edema; No deformity   ASSESSMENT AND PLAN: .    Assessment and Plan Assessment & Plan Essential hypertension Blood pressure readings at home range from 130-140 mmHg,  occasionally reaching 150 mmHg. Current medication is losartan  100 mg, at maximum effective dose. Concerns about elevated blood pressure due to family history of stroke. Discussed impact of stress and alcohol on blood pressure. - Add hydrochlorothiazide  12.5 mg to current regimen to achieve target blood pressure below 130 mmHg. - Monitor blood pressure at home and record readings. - Discuss stress management strategies and reduction of alcohol intake. - Schedule follow-up in a couple of months to assess blood pressure control and medication effectiveness.  Nonobstructive coronary artery disease Coronary CT angiogram from June 2019 showed minimal/mild nonobstructive coronary disease of the proximal LAD. Currently on aspirin  81 mg for CAD management. No current chest pain reported. Discussed option of taking aspirin  every other day to reduce bruising due to thin skin. - Continue aspirin  81 mg daily or consider every other day dosing to reduce bruising.  Type 2 diabetes mellitus without complications Type 2 diabetes is well-controlled with a recent hemoglobin A1c of 6.0%.  Hyperlipidemia Last LDL was 94 mg/dL in 7974. Currently on rosuvastatin  10 mg, effectively managing cholesterol levels. - Continue rosuvastatin  10 mg daily.         Dispo: APP 6 months, BP  Signed, Oneil Parchment, MD

## 2024-07-22 ENCOUNTER — Other Ambulatory Visit: Payer: Self-pay

## 2024-07-23 MED ORDER — LOSARTAN POTASSIUM 100 MG PO TABS: 100.0000 mg | ORAL_TABLET | Freq: Every day | ORAL | 3 refills | Status: AC

## 2024-07-23 MED ORDER — ROSUVASTATIN CALCIUM 10 MG PO TABS: ORAL_TABLET | ORAL | 3 refills | Status: AC

## 2024-07-30 ENCOUNTER — Other Ambulatory Visit: Payer: Medicare Other

## 2024-07-30 ENCOUNTER — Ambulatory Visit (HOSPITAL_BASED_OUTPATIENT_CLINIC_OR_DEPARTMENT_OTHER)
Admission: RE | Admit: 2024-07-30 | Discharge: 2024-07-30 | Disposition: A | Discharge status: Home / Self Care | Source: Ambulatory Visit | Attending: Family Medicine | Admitting: Family Medicine

## 2024-07-30 DIAGNOSIS — M8589 Other specified disorders of bone density and structure, multiple sites: Secondary | ICD-10-CM | POA: Insufficient documentation

## 2024-07-30 DIAGNOSIS — M85852 Other specified disorders of bone density and structure, left thigh: Secondary | ICD-10-CM | POA: Diagnosis not present

## 2024-07-30 DIAGNOSIS — M85851 Other specified disorders of bone density and structure, right thigh: Secondary | ICD-10-CM | POA: Diagnosis not present

## 2024-07-30 DIAGNOSIS — Z780 Asymptomatic menopausal state: Secondary | ICD-10-CM | POA: Diagnosis not present

## 2024-09-04 DIAGNOSIS — H5203 Hypermetropia, bilateral: Secondary | ICD-10-CM | POA: Diagnosis not present

## 2024-09-14 ENCOUNTER — Other Ambulatory Visit (HOSPITAL_COMMUNITY): Payer: Self-pay

## 2024-12-24 ENCOUNTER — Ambulatory Visit: Admitting: General Practice
